# Patient Record
Sex: Male | Born: 1935
Health system: Southern US, Community
[De-identification: ages and names within clinical notes are randomized; demographics above are authoritative.]

## PROBLEM LIST (undated history)

## (undated) DIAGNOSIS — D472 Monoclonal gammopathy: Secondary | ICD-10-CM

## (undated) DIAGNOSIS — H3530 Unspecified macular degeneration: Secondary | ICD-10-CM

## (undated) DIAGNOSIS — K921 Melena: Secondary | ICD-10-CM

## (undated) DIAGNOSIS — L719 Rosacea, unspecified: Secondary | ICD-10-CM

## (undated) DIAGNOSIS — E785 Hyperlipidemia, unspecified: Secondary | ICD-10-CM

## (undated) DIAGNOSIS — M1990 Unspecified osteoarthritis, unspecified site: Secondary | ICD-10-CM

## (undated) DIAGNOSIS — K222 Esophageal obstruction: Secondary | ICD-10-CM

## (undated) DIAGNOSIS — Z8371 Family history of colonic polyps: Secondary | ICD-10-CM

## (undated) DIAGNOSIS — Z83719 Family history of colon polyps, unspecified: Secondary | ICD-10-CM

## (undated) DIAGNOSIS — K589 Irritable bowel syndrome without diarrhea: Secondary | ICD-10-CM

## (undated) DIAGNOSIS — K5792 Diverticulitis of intestine, part unspecified, without perforation or abscess without bleeding: Secondary | ICD-10-CM

## (undated) DIAGNOSIS — K922 Gastrointestinal hemorrhage, unspecified: Secondary | ICD-10-CM

## (undated) DIAGNOSIS — E7800 Pure hypercholesterolemia, unspecified: Secondary | ICD-10-CM

## (undated) HISTORY — DX: Family history of colonic polyps: Z83.71

## (undated) HISTORY — PX: ROTATOR CUFF REPAIR: SHX139

## (undated) HISTORY — DX: Pure hypercholesterolemia, unspecified: E78.00

## (undated) HISTORY — DX: Gastrointestinal hemorrhage, unspecified: K92.2

## (undated) HISTORY — DX: Hyperlipidemia, unspecified: E78.5

## (undated) HISTORY — DX: Monoclonal gammopathy: D47.2

## (undated) HISTORY — PX: INGUINAL HERNIA REPAIR: SHX194

## (undated) HISTORY — DX: Rosacea, unspecified: L71.9

## (undated) HISTORY — DX: Family history of colon polyps, unspecified: Z83.719

## (undated) HISTORY — DX: Esophageal obstruction: K22.2

## (undated) HISTORY — DX: Diverticulitis of intestine, part unspecified, without perforation or abscess without bleeding: K57.92

## (undated) HISTORY — DX: Unspecified osteoarthritis, unspecified site: M19.90

## (undated) HISTORY — PX: OTHER SURGICAL HISTORY: SHX169

## (undated) HISTORY — DX: Irritable bowel syndrome, unspecified: K58.9

## (undated) HISTORY — PX: TONSILLECTOMY: SUR1361

## (undated) HISTORY — DX: Melena: K92.1

## (undated) HISTORY — DX: Unspecified macular degeneration: H35.30

---

## 1997-09-02 ENCOUNTER — Ambulatory Visit (HOSPITAL_COMMUNITY): Admission: RE | Admit: 1997-09-02 | Discharge: 1997-09-02 | Payer: Self-pay | Admitting: Orthopedic Surgery

## 1999-11-11 ENCOUNTER — Encounter (INDEPENDENT_AMBULATORY_CARE_PROVIDER_SITE_OTHER): Payer: Self-pay | Admitting: Gastroenterology

## 1999-11-11 ENCOUNTER — Other Ambulatory Visit: Admission: RE | Admit: 1999-11-11 | Discharge: 1999-11-11 | Payer: Self-pay | Admitting: Gastroenterology

## 1999-11-11 ENCOUNTER — Encounter (INDEPENDENT_AMBULATORY_CARE_PROVIDER_SITE_OTHER): Payer: Self-pay | Admitting: Specialist

## 2003-03-24 ENCOUNTER — Encounter (INDEPENDENT_AMBULATORY_CARE_PROVIDER_SITE_OTHER): Payer: Self-pay | Admitting: Gastroenterology

## 2004-01-16 DIAGNOSIS — D472 Monoclonal gammopathy: Secondary | ICD-10-CM

## 2004-01-16 HISTORY — DX: Monoclonal gammopathy: D47.2

## 2004-04-27 ENCOUNTER — Ambulatory Visit: Payer: Self-pay | Admitting: Oncology

## 2004-07-06 ENCOUNTER — Ambulatory Visit: Payer: Self-pay | Admitting: Oncology

## 2004-11-02 ENCOUNTER — Ambulatory Visit: Payer: Self-pay | Admitting: Oncology

## 2005-03-01 ENCOUNTER — Ambulatory Visit: Payer: Self-pay | Admitting: Oncology

## 2005-03-16 ENCOUNTER — Ambulatory Visit (HOSPITAL_COMMUNITY): Admission: RE | Admit: 2005-03-16 | Discharge: 2005-03-16 | Payer: Self-pay | Admitting: Oncology

## 2005-09-26 ENCOUNTER — Ambulatory Visit: Payer: Self-pay | Admitting: Oncology

## 2005-09-28 LAB — CBC WITH DIFFERENTIAL/PLATELET
BASO%: 0.4 % (ref 0.0–2.0)
Eosinophils Absolute: 0.2 10*3/uL (ref 0.0–0.5)
HCT: 49.3 % (ref 38.7–49.9)
LYMPH%: 14.4 % (ref 14.0–48.0)
MONO#: 0.5 10*3/uL (ref 0.1–0.9)
NEUT#: 6 10*3/uL (ref 1.5–6.5)
NEUT%: 75.8 % — ABNORMAL HIGH (ref 40.0–75.0)
Platelets: 276 10*3/uL (ref 145–400)
RBC: 5.24 10*6/uL (ref 4.20–5.71)
WBC: 7.9 10*3/uL (ref 4.0–10.0)
lymph#: 1.1 10*3/uL (ref 0.9–3.3)

## 2005-09-29 LAB — COMPREHENSIVE METABOLIC PANEL
ALT: 27 U/L (ref 0–40)
Albumin: 4.5 g/dL (ref 3.5–5.2)
CO2: 29 mEq/L (ref 19–32)
Calcium: 9.9 mg/dL (ref 8.4–10.5)
Chloride: 101 mEq/L (ref 96–112)
Glucose, Bld: 140 mg/dL — ABNORMAL HIGH (ref 70–99)
Sodium: 140 mEq/L (ref 135–145)
Total Bilirubin: 0.8 mg/dL (ref 0.3–1.2)
Total Protein: 6.8 g/dL (ref 6.0–8.3)

## 2005-09-29 LAB — LACTATE DEHYDROGENASE: LDH: 173 U/L (ref 94–250)

## 2005-09-29 LAB — IGG, IGA, IGM
IgA: 150 mg/dL (ref 68–378)
IgG (Immunoglobin G), Serum: 854 mg/dL (ref 694–1618)

## 2005-10-04 ENCOUNTER — Ambulatory Visit (HOSPITAL_COMMUNITY): Admission: RE | Admit: 2005-10-04 | Discharge: 2005-10-04 | Payer: Self-pay | Admitting: Oncology

## 2005-11-23 ENCOUNTER — Ambulatory Visit: Payer: Self-pay | Admitting: Family Medicine

## 2005-11-24 ENCOUNTER — Encounter: Admission: RE | Admit: 2005-11-24 | Discharge: 2005-11-24 | Payer: Self-pay | Admitting: Family Medicine

## 2005-11-29 ENCOUNTER — Ambulatory Visit: Payer: Self-pay | Admitting: Family Medicine

## 2005-12-27 ENCOUNTER — Ambulatory Visit: Payer: Self-pay | Admitting: Family Medicine

## 2006-01-10 ENCOUNTER — Ambulatory Visit: Payer: Self-pay | Admitting: Family Medicine

## 2006-02-08 ENCOUNTER — Ambulatory Visit: Payer: Self-pay | Admitting: Family Medicine

## 2006-03-27 ENCOUNTER — Ambulatory Visit: Payer: Self-pay | Admitting: Oncology

## 2006-03-29 LAB — CBC WITH DIFFERENTIAL/PLATELET
BASO%: 0.3 % (ref 0.0–2.0)
EOS%: 2.6 % (ref 0.0–7.0)
LYMPH%: 12.6 % — ABNORMAL LOW (ref 14.0–48.0)
MCH: 32.6 pg (ref 28.0–33.4)
MCHC: 34.9 g/dL (ref 32.0–35.9)
MCV: 93.4 fL (ref 81.6–98.0)
MONO%: 6.8 % (ref 0.0–13.0)
NEUT%: 77.7 % — ABNORMAL HIGH (ref 40.0–75.0)
Platelets: 272 10*3/uL (ref 145–400)
RBC: 4.97 10*6/uL (ref 4.20–5.71)
WBC: 7.9 10*3/uL (ref 4.0–10.0)

## 2006-03-29 LAB — COMPREHENSIVE METABOLIC PANEL
ALT: 24 U/L (ref 0–53)
Alkaline Phosphatase: 62 U/L (ref 39–117)
Creatinine, Ser: 0.94 mg/dL (ref 0.40–1.50)
Sodium: 140 mEq/L (ref 135–145)
Total Bilirubin: 0.8 mg/dL (ref 0.3–1.2)
Total Protein: 7 g/dL (ref 6.0–8.3)

## 2006-03-29 LAB — IGG, IGA, IGM: IgM, Serum: 14 mg/dL — ABNORMAL LOW (ref 60–263)

## 2006-09-25 ENCOUNTER — Ambulatory Visit: Payer: Self-pay | Admitting: Oncology

## 2006-09-27 LAB — CBC WITH DIFFERENTIAL/PLATELET
BASO%: 0.4 % (ref 0.0–2.0)
EOS%: 1.3 % (ref 0.0–7.0)
Eosinophils Absolute: 0.1 10*3/uL (ref 0.0–0.5)
LYMPH%: 11.4 % — ABNORMAL LOW (ref 14.0–48.0)
MCHC: 35.8 g/dL (ref 32.0–35.9)
MCV: 92.6 fL (ref 81.6–98.0)
MONO%: 3.5 % (ref 0.0–13.0)
NEUT#: 7.5 10*3/uL — ABNORMAL HIGH (ref 1.5–6.5)
RBC: 4.82 10*6/uL (ref 4.20–5.71)
RDW: 12.9 % (ref 11.2–14.6)
WBC: 9 10*3/uL (ref 4.0–10.0)

## 2006-09-27 LAB — COMPREHENSIVE METABOLIC PANEL
ALT: 27 U/L (ref 0–53)
AST: 21 U/L (ref 0–37)
Albumin: 4.3 g/dL (ref 3.5–5.2)
Alkaline Phosphatase: 60 U/L (ref 39–117)
Glucose, Bld: 152 mg/dL — ABNORMAL HIGH (ref 70–99)
Potassium: 4.5 mEq/L (ref 3.5–5.3)
Sodium: 143 mEq/L (ref 135–145)
Total Bilirubin: 0.6 mg/dL (ref 0.3–1.2)
Total Protein: 6.6 g/dL (ref 6.0–8.3)

## 2006-09-27 LAB — IGG, IGA, IGM: IgG (Immunoglobin G), Serum: 800 mg/dL (ref 694–1618)

## 2006-11-01 ENCOUNTER — Ambulatory Visit (HOSPITAL_COMMUNITY): Admission: RE | Admit: 2006-11-01 | Discharge: 2006-11-01 | Payer: Self-pay | Admitting: Oncology

## 2006-11-22 ENCOUNTER — Ambulatory Visit: Payer: Self-pay | Admitting: Family Medicine

## 2006-11-28 ENCOUNTER — Encounter: Payer: Self-pay | Admitting: Family Medicine

## 2006-11-30 DIAGNOSIS — Z8719 Personal history of other diseases of the digestive system: Secondary | ICD-10-CM | POA: Insufficient documentation

## 2006-11-30 DIAGNOSIS — Z860100 Personal history of colon polyps, unspecified: Secondary | ICD-10-CM | POA: Insufficient documentation

## 2006-11-30 DIAGNOSIS — R32 Unspecified urinary incontinence: Secondary | ICD-10-CM

## 2006-12-03 LAB — CONVERTED CEMR LAB
Cholesterol: 151 mg/dL (ref 0–200)
HDL: 36.6 mg/dL — ABNORMAL LOW (ref >39.0)
LDL Cholesterol: 95 mg/dL (ref 0–99)
Total CHOL/HDL Ratio: 4.1
Triglycerides: 95 mg/dL (ref 0–149)
VLDL: 19 mg/dL (ref 0–40)

## 2007-01-15 ENCOUNTER — Telehealth (INDEPENDENT_AMBULATORY_CARE_PROVIDER_SITE_OTHER): Payer: Self-pay | Admitting: *Deleted

## 2007-01-17 ENCOUNTER — Encounter: Payer: Self-pay | Admitting: Family Medicine

## 2007-02-01 ENCOUNTER — Encounter: Payer: Self-pay | Admitting: Family Medicine

## 2007-03-07 ENCOUNTER — Ambulatory Visit: Payer: Self-pay | Admitting: Family Medicine

## 2007-03-07 DIAGNOSIS — M129 Arthropathy, unspecified: Secondary | ICD-10-CM | POA: Insufficient documentation

## 2007-03-07 DIAGNOSIS — M810 Age-related osteoporosis without current pathological fracture: Secondary | ICD-10-CM | POA: Insufficient documentation

## 2007-03-26 ENCOUNTER — Ambulatory Visit: Payer: Self-pay | Admitting: Oncology

## 2007-03-28 LAB — CBC WITH DIFFERENTIAL/PLATELET
BASO%: 0.2 % (ref 0.0–2.0)
EOS%: 1.6 % (ref 0.0–7.0)
HCT: 46.3 % (ref 38.7–49.9)
LYMPH%: 11.6 % — ABNORMAL LOW (ref 14.0–48.0)
MCH: 32.6 pg (ref 28.0–33.4)
MCHC: 35.1 g/dL (ref 32.0–35.9)
MCV: 92.7 fL (ref 81.6–98.0)
MONO%: 5 % (ref 0.0–13.0)
NEUT%: 81.6 % — ABNORMAL HIGH (ref 40.0–75.0)
Platelets: 265 10*3/uL (ref 145–400)
RBC: 5 10*6/uL (ref 4.20–5.71)
WBC: 9.8 10*3/uL (ref 4.0–10.0)

## 2007-03-28 LAB — COMPREHENSIVE METABOLIC PANEL
ALT: 24 U/L (ref 0–53)
AST: 21 U/L (ref 0–37)
Alkaline Phosphatase: 65 U/L (ref 39–117)
CO2: 27 mEq/L (ref 19–32)
Creatinine, Ser: 1.08 mg/dL (ref 0.40–1.50)
Sodium: 142 mEq/L (ref 135–145)
Total Bilirubin: 0.7 mg/dL (ref 0.3–1.2)
Total Protein: 7 g/dL (ref 6.0–8.3)

## 2007-03-28 LAB — IGG, IGA, IGM: IgM, Serum: 14 mg/dL — ABNORMAL LOW (ref 60–263)

## 2007-03-28 LAB — LACTATE DEHYDROGENASE: LDH: 155 U/L (ref 94–250)

## 2007-04-18 DIAGNOSIS — L719 Rosacea, unspecified: Secondary | ICD-10-CM

## 2007-04-18 HISTORY — DX: Rosacea, unspecified: L71.9

## 2007-09-24 ENCOUNTER — Ambulatory Visit: Payer: Self-pay | Admitting: Oncology

## 2007-09-26 LAB — CBC WITH DIFFERENTIAL/PLATELET
Basophils Absolute: 0.1 10*3/uL (ref 0.0–0.1)
EOS%: 2.7 % (ref 0.0–7.0)
Eosinophils Absolute: 0.2 10*3/uL (ref 0.0–0.5)
HCT: 43.8 % (ref 38.7–49.9)
HGB: 15.7 g/dL (ref 13.0–17.1)
MCH: 32.3 pg (ref 28.0–33.4)
MCV: 90 fL (ref 81.6–98.0)
MONO%: 10 % (ref 0.0–13.0)
NEUT#: 5.3 10*3/uL (ref 1.5–6.5)
NEUT%: 69.7 % (ref 40.0–75.0)
RDW: 12.1 % (ref 11.2–14.6)
lymph#: 1.3 10*3/uL (ref 0.9–3.3)

## 2007-09-27 LAB — COMPREHENSIVE METABOLIC PANEL
AST: 20 U/L (ref 0–37)
Albumin: 4.3 g/dL (ref 3.5–5.2)
BUN: 19 mg/dL (ref 6–23)
Calcium: 9.3 mg/dL (ref 8.4–10.5)
Chloride: 106 mEq/L (ref 96–112)
Creatinine, Ser: 0.98 mg/dL (ref 0.40–1.50)
Glucose, Bld: 126 mg/dL — ABNORMAL HIGH (ref 70–99)
Potassium: 4.1 mEq/L (ref 3.5–5.3)

## 2007-09-27 LAB — IGG, IGA, IGM
IgA: 151 mg/dL (ref 68–378)
IgG (Immunoglobin G), Serum: 923 mg/dL (ref 694–1618)
IgM, Serum: 12 mg/dL — ABNORMAL LOW (ref 60–263)

## 2008-03-25 ENCOUNTER — Ambulatory Visit: Payer: Self-pay | Admitting: Oncology

## 2008-03-27 LAB — COMPREHENSIVE METABOLIC PANEL
Alkaline Phosphatase: 64 U/L (ref 39–117)
BUN: 16 mg/dL (ref 6–23)
CO2: 27 mEq/L (ref 19–32)
Creatinine, Ser: 0.84 mg/dL (ref 0.40–1.50)
Glucose, Bld: 86 mg/dL (ref 70–99)
Total Bilirubin: 0.8 mg/dL (ref 0.3–1.2)

## 2008-03-27 LAB — CBC WITH DIFFERENTIAL/PLATELET
Eosinophils Absolute: 0.2 10*3/uL (ref 0.0–0.5)
HCT: 49.1 % (ref 38.7–49.9)
LYMPH%: 14.3 % (ref 14.0–48.0)
MCV: 94.5 fL (ref 81.6–98.0)
MONO#: 0.6 10*3/uL (ref 0.1–0.9)
MONO%: 8.9 % (ref 0.0–13.0)
NEUT#: 4.7 10*3/uL (ref 1.5–6.5)
NEUT%: 73.6 % (ref 40.0–75.0)
Platelets: 243 10*3/uL (ref 145–400)
RBC: 5.19 10*6/uL (ref 4.20–5.71)
WBC: 6.4 10*3/uL (ref 4.0–10.0)

## 2008-03-27 LAB — IGG, IGA, IGM: IgA: 168 mg/dL (ref 68–378)

## 2008-03-27 LAB — LACTATE DEHYDROGENASE: LDH: 162 U/L (ref 94–250)

## 2008-09-21 ENCOUNTER — Ambulatory Visit: Payer: Self-pay | Admitting: Oncology

## 2008-09-23 LAB — LACTATE DEHYDROGENASE: LDH: 174 U/L (ref 94–250)

## 2008-09-23 LAB — CBC WITH DIFFERENTIAL/PLATELET
BASO%: 0.6 % (ref 0.0–2.0)
Basophils Absolute: 0.1 10*3/uL (ref 0.0–0.1)
EOS%: 2.2 % (ref 0.0–7.0)
Eosinophils Absolute: 0.2 10*3/uL (ref 0.0–0.5)
HCT: 42.7 % (ref 38.4–49.9)
HGB: 15.4 g/dL (ref 13.0–17.1)
LYMPH%: 14.5 % (ref 14.0–49.0)
MCH: 32.1 pg (ref 27.2–33.4)
MCHC: 36.1 g/dL — ABNORMAL HIGH (ref 32.0–36.0)
MCV: 89 fL (ref 79.3–98.0)
MONO#: 0.6 10*3/uL (ref 0.1–0.9)
MONO%: 7.2 % (ref 0.0–14.0)
NEUT#: 6.2 10*3/uL (ref 1.5–6.5)
NEUT%: 75.5 % — ABNORMAL HIGH (ref 39.0–75.0)
Platelets: 228 10*3/uL (ref 140–400)
RBC: 4.8 10*6/uL (ref 4.20–5.82)
RDW: 13.2 % (ref 11.0–14.6)
WBC: 8.3 10*3/uL (ref 4.0–10.3)
lymph#: 1.2 10*3/uL (ref 0.9–3.3)

## 2008-09-23 LAB — COMPREHENSIVE METABOLIC PANEL
ALT: 18 U/L (ref 0–53)
Albumin: 4.3 g/dL (ref 3.5–5.2)
CO2: 26 mEq/L (ref 19–32)
Glucose, Bld: 99 mg/dL (ref 70–99)
Potassium: 4.4 mEq/L (ref 3.5–5.3)
Sodium: 141 mEq/L (ref 135–145)
Total Bilirubin: 0.7 mg/dL (ref 0.3–1.2)
Total Protein: 7 g/dL (ref 6.0–8.3)

## 2008-09-23 LAB — IGG, IGA, IGM: IgG (Immunoglobin G), Serum: 857 mg/dL (ref 694–1618)

## 2008-10-27 ENCOUNTER — Ambulatory Visit: Payer: Self-pay | Admitting: Family Medicine

## 2008-10-27 DIAGNOSIS — E039 Hypothyroidism, unspecified: Secondary | ICD-10-CM | POA: Insufficient documentation

## 2008-10-27 DIAGNOSIS — Z87898 Personal history of other specified conditions: Secondary | ICD-10-CM | POA: Insufficient documentation

## 2008-10-27 DIAGNOSIS — M353 Polymyalgia rheumatica: Secondary | ICD-10-CM | POA: Insufficient documentation

## 2008-10-27 DIAGNOSIS — T50995A Adverse effect of other drugs, medicaments and biological substances, initial encounter: Secondary | ICD-10-CM | POA: Insufficient documentation

## 2008-10-27 DIAGNOSIS — E785 Hyperlipidemia, unspecified: Secondary | ICD-10-CM

## 2008-10-27 DIAGNOSIS — D649 Anemia, unspecified: Secondary | ICD-10-CM | POA: Insufficient documentation

## 2008-10-27 LAB — HM COLONOSCOPY

## 2008-11-11 ENCOUNTER — Ambulatory Visit: Payer: Self-pay | Admitting: Family Medicine

## 2008-11-11 LAB — CONVERTED CEMR LAB
OCCULT 1: NEGATIVE
OCCULT 2: NEGATIVE
OCCULT 3: NEGATIVE

## 2008-12-23 ENCOUNTER — Ambulatory Visit: Payer: Self-pay | Admitting: Internal Medicine

## 2008-12-23 DIAGNOSIS — R1319 Other dysphagia: Secondary | ICD-10-CM

## 2008-12-23 DIAGNOSIS — R198 Other specified symptoms and signs involving the digestive system and abdomen: Secondary | ICD-10-CM

## 2009-01-12 ENCOUNTER — Encounter: Payer: Self-pay | Admitting: Internal Medicine

## 2009-01-12 ENCOUNTER — Ambulatory Visit: Payer: Self-pay | Admitting: Internal Medicine

## 2009-01-18 ENCOUNTER — Encounter: Payer: Self-pay | Admitting: Internal Medicine

## 2009-02-24 ENCOUNTER — Telehealth: Payer: Self-pay | Admitting: Family Medicine

## 2009-03-09 ENCOUNTER — Telehealth: Payer: Self-pay | Admitting: Family Medicine

## 2009-03-16 ENCOUNTER — Ambulatory Visit: Payer: Self-pay | Admitting: Oncology

## 2009-03-18 LAB — IGG, IGA, IGM: IgG (Immunoglobin G), Serum: 845 mg/dL (ref 694–1618)

## 2009-03-18 LAB — LACTATE DEHYDROGENASE: LDH: 174 U/L (ref 94–250)

## 2009-03-18 LAB — CBC WITH DIFFERENTIAL/PLATELET
Eosinophils Absolute: 0.3 10*3/uL (ref 0.0–0.5)
MONO#: 0.7 10*3/uL (ref 0.1–0.9)
NEUT#: 6.3 10*3/uL (ref 1.5–6.5)
RBC: 4.7 10*6/uL (ref 4.20–5.82)
RDW: 13.5 % (ref 11.0–14.6)
WBC: 8.4 10*3/uL (ref 4.0–10.3)
lymph#: 1.2 10*3/uL (ref 0.9–3.3)

## 2009-03-18 LAB — COMPREHENSIVE METABOLIC PANEL
Albumin: 3.9 g/dL (ref 3.5–5.2)
Alkaline Phosphatase: 67 U/L (ref 39–117)
CO2: 31 mEq/L (ref 19–32)
Chloride: 106 mEq/L (ref 96–112)
Glucose, Bld: 94 mg/dL (ref 70–99)
Potassium: 4.3 mEq/L (ref 3.5–5.3)
Sodium: 142 mEq/L (ref 135–145)
Total Protein: 6.7 g/dL (ref 6.0–8.3)

## 2009-09-15 ENCOUNTER — Ambulatory Visit: Payer: Self-pay | Admitting: Oncology

## 2009-11-04 ENCOUNTER — Ambulatory Visit: Payer: Self-pay | Admitting: Family Medicine

## 2009-11-04 ENCOUNTER — Telehealth: Payer: Self-pay | Admitting: Family Medicine

## 2009-11-04 DIAGNOSIS — K219 Gastro-esophageal reflux disease without esophagitis: Secondary | ICD-10-CM

## 2009-11-04 DIAGNOSIS — J309 Allergic rhinitis, unspecified: Secondary | ICD-10-CM | POA: Insufficient documentation

## 2009-11-04 DIAGNOSIS — E559 Vitamin D deficiency, unspecified: Secondary | ICD-10-CM | POA: Insufficient documentation

## 2009-11-10 ENCOUNTER — Ambulatory Visit: Payer: Self-pay | Admitting: Family Medicine

## 2009-11-10 LAB — CONVERTED CEMR LAB
OCCULT 1: NEGATIVE
OCCULT 2: NEGATIVE
OCCULT 3: NEGATIVE

## 2009-11-15 ENCOUNTER — Encounter: Payer: Self-pay | Admitting: Family Medicine

## 2009-11-24 ENCOUNTER — Encounter: Payer: Self-pay | Admitting: Family Medicine

## 2010-03-15 ENCOUNTER — Ambulatory Visit: Payer: Self-pay | Admitting: Oncology

## 2010-03-17 LAB — CBC WITH DIFFERENTIAL/PLATELET
BASO%: 0.4 % (ref 0.0–2.0)
Basophils Absolute: 0 10*3/uL (ref 0.0–0.1)
EOS%: 0.8 % (ref 0.0–7.0)
Eosinophils Absolute: 0.1 10*3/uL (ref 0.0–0.5)
HCT: 48.2 % (ref 38.4–49.9)
HGB: 16.5 g/dL (ref 13.0–17.1)
LYMPH%: 10.9 % — ABNORMAL LOW (ref 14.0–49.0)
MCH: 32.1 pg (ref 27.2–33.4)
MCHC: 34.2 g/dL (ref 32.0–36.0)
MCV: 93.9 fL (ref 79.3–98.0)
MONO#: 0.5 10*3/uL (ref 0.1–0.9)
MONO%: 5.9 % (ref 0.0–14.0)
NEUT#: 7.4 10*3/uL — ABNORMAL HIGH (ref 1.5–6.5)
NEUT%: 82 % — ABNORMAL HIGH (ref 39.0–75.0)
Platelets: 232 10*3/uL (ref 140–400)
RBC: 5.13 10*6/uL (ref 4.20–5.82)
RDW: 13.4 % (ref 11.0–14.6)
WBC: 9 10*3/uL (ref 4.0–10.3)
lymph#: 1 10*3/uL (ref 0.9–3.3)

## 2010-03-17 LAB — COMPREHENSIVE METABOLIC PANEL
ALT: 28 U/L (ref 0–53)
AST: 25 U/L (ref 0–37)
Albumin: 4.3 g/dL (ref 3.5–5.2)
Alkaline Phosphatase: 81 U/L (ref 39–117)
BUN: 16 mg/dL (ref 6–23)
CO2: 32 mEq/L (ref 19–32)
Calcium: 9.6 mg/dL (ref 8.4–10.5)
Chloride: 102 mEq/L (ref 96–112)
Creatinine, Ser: 1 mg/dL (ref 0.40–1.50)
Glucose, Bld: 135 mg/dL — ABNORMAL HIGH (ref 70–99)
Potassium: 4.4 mEq/L (ref 3.5–5.3)
Sodium: 141 mEq/L (ref 135–145)
Total Bilirubin: 1 mg/dL (ref 0.3–1.2)
Total Protein: 7.3 g/dL (ref 6.0–8.3)

## 2010-03-17 LAB — LACTATE DEHYDROGENASE: LDH: 155 U/L (ref 94–250)

## 2010-05-15 LAB — CONVERTED CEMR LAB
ALT: 19 units/L (ref 0–53)
ALT: 36 units/L (ref 0–53)
AST: 20 units/L (ref 0–37)
AST: 30 units/L (ref 0–37)
Albumin: 4.2 g/dL (ref 3.5–5.2)
Albumin: 4.4 g/dL (ref 3.5–5.2)
Alkaline Phosphatase: 62 units/L (ref 39–117)
Alkaline Phosphatase: 97 units/L (ref 39–117)
BUN: 17 mg/dL (ref 6–23)
BUN: 18 mg/dL (ref 6–23)
Basophils Absolute: 0 10*3/uL (ref 0.0–0.1)
Basophils Absolute: 0.1 10*3/uL (ref 0.0–0.1)
Basophils Relative: 0.2 % (ref 0.0–3.0)
Basophils Relative: 0.7 % (ref 0.0–3.0)
Bilirubin Urine: NEGATIVE
Bilirubin Urine: NEGATIVE
Bilirubin, Direct: 0.1 mg/dL (ref 0.0–0.3)
Bilirubin, Direct: 0.2 mg/dL (ref 0.0–0.3)
Blood in Urine, dipstick: NEGATIVE
Blood in Urine, dipstick: NEGATIVE
CO2: 30 meq/L (ref 19–32)
CO2: 31 meq/L (ref 19–32)
Calcium: 9.6 mg/dL (ref 8.4–10.5)
Calcium: 9.7 mg/dL (ref 8.4–10.5)
Chloride: 104 meq/L (ref 96–112)
Chloride: 107 meq/L (ref 96–112)
Cholesterol: 280 mg/dL — ABNORMAL HIGH (ref 0–200)
Cholesterol: 284 mg/dL — ABNORMAL HIGH (ref 0–200)
Creatinine, Ser: 0.9 mg/dL (ref 0.4–1.5)
Creatinine, Ser: 1 mg/dL (ref 0.4–1.5)
Direct LDL: 203.1 mg/dL
Direct LDL: 216.7 mg/dL
Eosinophils Absolute: 0.2 10*3/uL (ref 0.0–0.7)
Eosinophils Absolute: 0.2 10*3/uL (ref 0.0–0.7)
Eosinophils Relative: 1.7 % (ref 0.0–5.0)
Eosinophils Relative: 2.2 % (ref 0.0–5.0)
GFR calc non Af Amer: 75.97 mL/min (ref >60)
GFR calc non Af Amer: 89 mL/min (ref >60)
Glucose, Bld: 104 mg/dL — ABNORMAL HIGH (ref 70–99)
Glucose, Bld: 94 mg/dL (ref 70–99)
Glucose, Urine, Semiquant: NEGATIVE
Glucose, Urine, Semiquant: NEGATIVE
HCT: 46.3 % (ref 39.0–52.0)
HCT: 46.9 % (ref 39.0–52.0)
HDL: 35.8 mg/dL — ABNORMAL LOW (ref >39.00)
HDL: 36 mg/dL — ABNORMAL LOW (ref >39.00)
Hemoglobin: 16.5 g/dL (ref 13.0–17.0)
Hemoglobin: 16.7 g/dL (ref 13.0–17.0)
Ketones, urine, test strip: NEGATIVE
Ketones, urine, test strip: NEGATIVE
Lymphocytes Relative: 10.8 % — ABNORMAL LOW (ref 12.0–46.0)
Lymphocytes Relative: 20.8 % (ref 12.0–46.0)
Lymphs Abs: 0.9 10*3/uL (ref 0.7–4.0)
Lymphs Abs: 1.5 10*3/uL (ref 0.7–4.0)
MCHC: 35.3 g/dL (ref 30.0–36.0)
MCHC: 36 g/dL (ref 30.0–36.0)
MCV: 91.8 fL (ref 78.0–100.0)
MCV: 94.9 fL (ref 78.0–100.0)
Monocytes Absolute: 0.6 10*3/uL (ref 0.1–1.0)
Monocytes Absolute: 0.7 10*3/uL (ref 0.1–1.0)
Monocytes Relative: 6.9 % (ref 3.0–12.0)
Monocytes Relative: 8.9 % (ref 3.0–12.0)
Neutro Abs: 5 10*3/uL (ref 1.4–7.7)
Neutro Abs: 7 10*3/uL (ref 1.4–7.7)
Neutrophils Relative %: 67.9 % (ref 43.0–77.0)
Neutrophils Relative %: 79.9 % — ABNORMAL HIGH (ref 43.0–77.0)
Nitrite: NEGATIVE
Nitrite: NEGATIVE
PSA: 5.2 ng/mL — ABNORMAL HIGH (ref 0.10–4.00)
PSA: 6.92 ng/mL — ABNORMAL HIGH (ref 0.10–4.00)
Platelets: 241 10*3/uL (ref 150.0–400.0)
Platelets: 274 10*3/uL (ref 150.0–400.0)
Potassium: 5 meq/L (ref 3.5–5.1)
Potassium: 5.2 meq/L — ABNORMAL HIGH (ref 3.5–5.1)
RBC: 4.94 M/uL (ref 4.22–5.81)
RBC: 5.04 M/uL (ref 4.22–5.81)
RDW: 12.5 % (ref 11.5–14.6)
RDW: 13.4 % (ref 11.5–14.6)
Sodium: 142 meq/L (ref 135–145)
Sodium: 146 meq/L — ABNORMAL HIGH (ref 135–145)
Specific Gravity, Urine: 1.02
Specific Gravity, Urine: 1.025
TSH: 1.16 microintl units/mL (ref 0.35–5.50)
TSH: 1.39 microintl units/mL (ref 0.35–5.50)
Total Bilirubin: 1 mg/dL (ref 0.3–1.2)
Total Bilirubin: 1.3 mg/dL — ABNORMAL HIGH (ref 0.3–1.2)
Total CHOL/HDL Ratio: 8
Total CHOL/HDL Ratio: 8
Total Protein: 7.3 g/dL (ref 6.0–8.3)
Total Protein: 7.3 g/dL (ref 6.0–8.3)
Triglycerides: 180 mg/dL — ABNORMAL HIGH (ref 0.0–149.0)
Triglycerides: 244 mg/dL — ABNORMAL HIGH (ref 0.0–149.0)
Urobilinogen, UA: 0.2
Urobilinogen, UA: 0.2
VLDL: 36 mg/dL (ref 0.0–40.0)
VLDL: 48.8 mg/dL — ABNORMAL HIGH (ref 0.0–40.0)
Vit D, 25-Hydroxy: 20 ng/mL — ABNORMAL LOW (ref 30–89)
Vit D, 25-Hydroxy: 24 ng/mL — ABNORMAL LOW (ref 30–89)
WBC Urine, dipstick: NEGATIVE
WBC Urine, dipstick: NEGATIVE
WBC: 7.4 10*3/uL (ref 4.5–10.5)
WBC: 8.7 10*3/uL (ref 4.5–10.5)
pH: 7
pH: 7

## 2010-05-19 NOTE — Assessment & Plan Note (Signed)
Summary: EMP/PT FASTING/CJR   Vital Signs:  Patient profile:   77 year old male Height:      67 inches Weight:      169 pounds BMI:     26.56 O2 Sat:      94 % Temp:     98 degrees F Pulse rate:   60 / minute Pulse rhythm:   regular BP sitting:   134 / 78  (left arm)  Vitals Entered By: Pura Spice, RN (November 04, 2009 8:31 AM)  Contraindications/Deferment of Procedures/Staging:    Test/Procedure: Pneumovax vaccine    Reason for deferment: patient declined     Test/Procedure: TD vaccine    Reason for deferment: declined  CC: go over problems refill meds pt stated stopped all meds about 8 months ago.  Is Patient Diabetic? 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 Colletta Maryland for his arthritis and osteoporosis He relates he's not been taking any medications for the past 8 months Has no urinary symptoms no nocturia no urgency no increased frequencybut does have some urinary incontinence at times Continues to have GERD with some dysphagia which is controlled with Nexium and to restart Nexium 40 mg q. day  Allergies (verified): No Known Drug Allergies  Past History:  Past Medical History: Last updated: 12/23/2008 Arthritis Blood in Stool High Cholesterol Colonic polyps, hx of (hyperplastic) Diverticulitis, hx of Urinary incontinence Esophageal Stricture GI Bleed Irritable Bowel Syndrome  Past Surgical History: Last updated: 11/30/2006 Inguinal herniorrhaphy Rotator cuff repair Diviated Septum Tonsillectomy  Social History: Last updated: 12/23/2008 Retired Widowed Former  Smoker Alcohol use-yes: one per month  Drug use-no Regular exercise-no Daily Caffeine Use: one daily   Risk Factors: Smoking Status: quit (11/30/2006)  Review of Systems      See HPI  The patient denies anorexia, fever, weight loss, weight gain, vision loss, decreased hearing, hoarseness, chest pain, syncope, dyspnea on exertion, peripheral edema, prolonged cough, headaches, hemoptysis, abdominal pain, melena, hematochezia, severe indigestion/heartburn, hematuria, incontinence, genital sores, muscle weakness, suspicious skin lesions, transient blindness, difficulty walking, depression, unusual weight change, abnormal bleeding, enlarged lymph nodes, angioedema, breast masses, and testicular masses.    Physical Exam  General:  Well-developed,well-nourished,in no acute distress; alert,appropriate and cooperative throughout examination Head:  Normocephalic and atraumatic without obvious abnormalities. No apparent alopecia or balding. Eyes:  No corneal or conjunctival inflammation noted. EOMI. Perrla. Funduscopic exam benign, without hemorrhages, exudates or papilledema. Vision grossly normal. Ears:  External ear exam shows no significant lesions or deformities.  Otoscopic examination reveals clear canals, tympanic membranes are intact bilaterally without bulging, retraction, inflammation or discharge. Hearing is grossly normal bilaterally. Nose:  pale boggy nasal mucosa with postnasal drainage clear drainage Mouth:  Oral mucosa and oropharynx without lesions or exudates.  Teeth in good repair. Neck:  No deformities, masses, or tenderness noted. Chest Clevinger:  No deformities, masses, tenderness or gynecomastia noted. Breasts:  No masses or gynecomastia noted Lungs:  Normal respiratory effort, chest expands symmetrically. Lungs are clear to auscultation, no crackles or wheezes. Heart:  Normal rate and regular rhythm. S1 and S2 normal without  gallop, murmur, click, rub or other extra  sounds. Abdomen:  Bowel sounds positive,abdomen soft and non-tender without masses, organomegaly or hernias noted. Rectal:  No external abnormalities noted. Normal sphincter tone. No rectal masses or tenderness. Genitalia:  Testes bilaterally descended without nodularity, tenderness or masses. No scrotal masses or lesions. No penis lesions or urethral discharge. Prostate:  1+ enlarged.   Msk:  arthritic changes of stools we'll hand and also some tenderness both in the medially and laterally Pulses:  R and L carotid,radial,femoral,dorsalis pedis and posterior tibial pulses are full and equal bilaterally Extremities:  No clubbing, cyanosis, edema, or deformity noted with normal full range of motion of all joints.   Neurologic:  No cranial nerve deficits noted. Station and gait are normal. Plantar reflexes are down-going bilaterally. DTRs are symmetrical throughout. Sensory, motor and coordinative functions appear intact. Skin:  Intact without suspicious lesions or rashes Cervical Nodes:  No lymphadenopathy noted Axillary Nodes:  No palpable lymphadenopathy Inguinal Nodes:  No significant adenopathy Psych:  Cognition and judgment appear intact. Alert and cooperative with normal attention span and concentration. No apparent delusions, illusions, hallucinations   Impression & Recommendations:  Problem # 1:  ALLERGIC RHINITIS (ICD-477.9) Assessment New past frontal nasal spray  Problem # 2:  GERD (ICD-530.81) Assessment: Improved Nexium 40 mg q.d.  Problem # 3:  POLYMYALGIA RHEUMATICA (ICD-725) Assessment: Unchanged  His updated medication list for this problem includes:    Aspirin 81 Mg Tabs (Aspirin) ..... One tablet by mouth daily  Problem # 4:  HYPERLIPIDEMIA (ICD-272.4) Assessment: Unchanged  The following medications were removed from the medication list:    Simvastatin 80 Mg Tabs (Simvastatin) .Marland Kitchen... 1 by mouth at bedtime His updated medication list for this problem includes:     Crestor 10 Mg Tabs (Rosuvastatin calcium) ..... One q.d.  Orders: Specimen Handling (56433) Venipuncture (29518) TLB-Lipid Panel (80061-LIPID) TLB-Hepatic/Liver Function Pnl (80076-HEPATIC)  Problem # 5:  OSTEOPOROSIS (ICD-733.00) Assessment: Improved  His updated medication list for this problem includes:    Actonel 150 Mg Tabs (Risedronate sodium) .Marland Kitchen... 1 per month as directedx    Vitamin D (ergocalciferol) 50000 Unit Caps (Ergocalciferol) .Marland Kitchen... 1 by mouth every week for 12 weeks  Problem # 6:  URINARY INCONTINENCE (ICD-788.30) Assessment: Improved  Problem # 9:  VITAMIN D DEFICIENCY (ICD-268.9) Assessment: New  Orders: T-Vitamin D (25-Hydroxy) (84166-06301) Specimen Handling (60109) Venipuncture (32355)  Problem # 10:  BENIGN PROSTATIC HYPERTROPHY, HX OF (ICD-V13.8) Assessment: Unchanged Enablex 7.5 mg qd  Complete Medication List: 1)  Aspirin 81 Mg Tabs (Aspirin) .... One tablet by mouth daily 2)  Actonel 150 Mg Tabs (Risedronate sodium) .Marland Kitchen.. 1 per month as directedx 3)  Vitamin D (ergocalciferol) 50000 Unit Caps (Ergocalciferol) .Marland Kitchen.. 1 by mouth every week for 12 weeks 4)  Enablex 7.5 Mg Xr24h-tab (Darifenacin hydrobromide) .... As needed 5)  Hydrocodone-homatropine 5-1.5 Mg/17ml Syrp (Hydrocodone-homatropine) .Marland Kitchen.. 1-2 tsp every 4-6 hrs as needed cough 6)  Crestor 10 Mg Tabs (Rosuvastatin calcium) .... One q.d.  Other Orders: EKG w/ Interpretation (93000) UA Dipstick w/o Micro (automated)  (81003) TLB-BMP (Basic Metabolic Panel-BMET) (80048-METABOL) TLB-CBC Platelet - w/Differential (85025-CBCD) TLB-TSH (Thyroid Stimulating Hormone) (84443-TSH) TLB-PSA (Prostate Specific Antigen) (84153-PSA)  Patient Instructions: 1)  To get Bone density stidy after geting site of the last 2)  Astepro 2 sprays each nostril each day 3)  Take Nexium 1 each day 4)  Try Enablex or vesicare for urina urgency 5)  cont ASA 81 mg qd Prescriptions: CRESTOR 10 MG  TABS (ROSUVASTATIN  CALCIUM) one q.d.  #30 x 11   Entered and Authorized by:   Judithann Sheen MD   Signed by:   Judithann Sheen MD on 11/15/2009   Method used:   Print then Give to Patient   RxID:   [id number] VITAMIN D (ERGOCALCIFEROL) 50000 UNIT CAPS (ERGOCALCIFEROL) 1 by mouth every week for 12 weeks  #12 x 2   Entered and Authorized by:   Judithann Sheen MD   Signed by:   Judithann Sheen MD on 11/07/2009   Method used:   Electronically to        [facility]* (retail)       [address] [place], [place]  [address]       Ph: [phone]       Fax: [phone]   RxID:   [id number]   Laboratory Results   Urine Tests  Date/Time Recieved: November 04, 2009 11:04 AM  Date/Time Reported: November 04, 2009 11:04 AM   Routine Urinalysis   Color: yellow Appearance: Clear Glucose: negative   (Normal Range: Negative) Bilirubin: negative   (Normal Range: Negative) Ketone: negative   (Normal Range: Negative) Spec. Gravity: 1.025   (Normal Range: 1.003-1.035) Blood: negative   (Normal Range: Negative) pH: 7.0   (Normal Range: 5.0-8.0) Protein: trace   (Normal Range: Negative) Urobilinogen: 0.2   (Normal Range: 0-1) Nitrite: negative   (Normal Range: Negative) Leukocyte Esterace: negative   (Normal Range: Negative)    Comments: Wynona Canes, CMA  November 04, 2009 11:04 AM

## 2010-05-19 NOTE — Letter (Signed)
Summary: Results Follow-up Letter  [place] [facility]  [address] [place], [place] [address]   Phone: [phone]  Fax: [phone]    11/15/2009  [address] [place], [place]  [address]  Dear Mr. FLANERY,   The following are the results of your recent test(s): Hemocult cards were all negative.        Sincerely,     Dr Gwenyth Bender Stafford,MD  [facility]

## 2010-05-19 NOTE — Progress Notes (Signed)
Summary: Pt needs referral to get Bone Density test done  Phone Note Call from Patient Call back at Work Phone [phone]   Caller: Patient Summary of Call: Pt called and said that [facility], told pt that he would need to get referral from Dr. Scotty Court in order to get bone density done. Pls call.  Initial call taken by: Lucy Antigua,  November 04, 2009 11:03 AM  Follow-up for Phone Call        ok per dr Alfonzo Feller and request forwarded to [facility]  Follow-up by: Pura Spice, RN,  November 04, 2009 11:16 AM

## 2010-06-27 ENCOUNTER — Encounter: Payer: Self-pay | Admitting: Family Medicine

## 2010-07-12 ENCOUNTER — Encounter: Payer: Self-pay | Admitting: Family Medicine

## 2010-07-12 ENCOUNTER — Ambulatory Visit (INDEPENDENT_AMBULATORY_CARE_PROVIDER_SITE_OTHER): Payer: Medicare Other | Admitting: Family Medicine

## 2010-07-12 VITALS — BP 122/72 | HR 76 | Temp 98.1°F | Wt 165.0 lb

## 2010-07-12 DIAGNOSIS — N138 Other obstructive and reflux uropathy: Secondary | ICD-10-CM

## 2010-07-12 DIAGNOSIS — N419 Inflammatory disease of prostate, unspecified: Secondary | ICD-10-CM

## 2010-07-12 DIAGNOSIS — R3989 Other symptoms and signs involving the genitourinary system: Secondary | ICD-10-CM

## 2010-07-12 DIAGNOSIS — N139 Obstructive and reflux uropathy, unspecified: Secondary | ICD-10-CM

## 2010-07-12 DIAGNOSIS — M1711 Unilateral primary osteoarthritis, right knee: Secondary | ICD-10-CM

## 2010-07-12 DIAGNOSIS — R05 Cough: Secondary | ICD-10-CM

## 2010-07-12 DIAGNOSIS — R3915 Urgency of urination: Secondary | ICD-10-CM

## 2010-07-12 DIAGNOSIS — N401 Enlarged prostate with lower urinary tract symptoms: Secondary | ICD-10-CM

## 2010-07-12 DIAGNOSIS — L719 Rosacea, unspecified: Secondary | ICD-10-CM

## 2010-07-12 DIAGNOSIS — M810 Age-related osteoporosis without current pathological fracture: Secondary | ICD-10-CM

## 2010-07-12 DIAGNOSIS — M353 Polymyalgia rheumatica: Secondary | ICD-10-CM

## 2010-07-12 DIAGNOSIS — E785 Hyperlipidemia, unspecified: Secondary | ICD-10-CM

## 2010-07-12 DIAGNOSIS — R39198 Other difficulties with micturition: Secondary | ICD-10-CM

## 2010-07-12 MED ORDER — CIPROFLOXACIN HCL 500 MG PO TABS: 500.0000 mg | ORAL_TABLET | Freq: Two times a day (BID) | ORAL | Status: AC

## 2010-07-12 MED ORDER — ERGOCALCIFEROL 1.25 MG (50000 UT) PO CAPS: 50000.0000 [IU] | ORAL_CAPSULE | ORAL | Status: DC

## 2010-07-12 NOTE — Patient Instructions (Addendum)
Tiler, your prostate is larger than the last exam and you also have prostatitis Haynes Bast will help with enlarged prostate  And urgency of urination and nocturia Before Lonna Cobb is out call me either here or on my cell phone [phone] To change to avodart, I want to recheck prostate in 3 months, also I need to treat you wit cipro 500mg  twice daily for 14 days Stop actonel start vit D 50000 units weekly \\Take  celebrex 200 mg after breakfat and supper, for  The knee Start Nexium 400 mg each am to help stop cough, cough sounds like gerd  to get chest xray for follow up lastt xray and also since you are continuing to cough Go see Dr. Suzzanne Cloud Take  1/2 tab crestor each day for hyperlipedemia

## 2010-07-13 ENCOUNTER — Ambulatory Visit (INDEPENDENT_AMBULATORY_CARE_PROVIDER_SITE_OTHER)
Admission: RE | Admit: 2010-07-13 | Discharge: 2010-07-13 | Disposition: A | Discharge status: Home / Self Care | Payer: Medicare Other | Source: Ambulatory Visit | Attending: Family Medicine | Admitting: Family Medicine

## 2010-07-13 DIAGNOSIS — R053 Chronic cough: Secondary | ICD-10-CM

## 2010-07-13 DIAGNOSIS — R05 Cough: Secondary | ICD-10-CM

## 2010-08-08 NOTE — Progress Notes (Signed)
  Subjective:    Patient ID: Bryan Norris, male    DOB: Nov 01, 1924, 77 y.o.   MRN: [id number] This 77 year old white male who over his in with multiple complaints he has been having pain in both knees but especially the right knee which is difficult to be in and he also had injection in the right knee by Dr. Darrol Poke has been under the care of rheumatologist for polymyalgia rheumatica Under care doctor to have seen dermatologist for rosacea  Has had GROUP DENTAL PROBLEMS GOING TO 3 DIFFERENT THAN HIS AND SOME CONCERN OVER THE BONE LOSS IN THE JAW WHICH MAY BE SECONDARY TO ACTONEL SO THIS WAS STOPPED Patient has began having difficult urination as well as increased urinary urgency and frequency One of his main complaints is that it is that he has had a chronic cough over the past several months but no fever and not productive and from the description it sounds like a reflux cough normal however plan to get a chest x-ray   Review of Systemssee history of present illness    Objective:   Physical Examthe patient is a well-developed well-nourished white male who appears depressed but not in distress HEENT negative carotid pulses are good thyroid is nonpalpable multiple bridges fillings an implants Lungs clear to palpation percussion and auscultation no rales or wheezes no dullness to percussion  Heart no evidence of cardiomegaly heart sounds are good without murmurs rectal willAbdomen liver spleen and kidneys are nonpalpable no masses bowel sounds normal Rectal examination reveals prostate to be enlarged x2 Right knee is swollen stiff and painful movement Left knee slightly swollen and tender bilaterally       Assessment & Plan:  BPH with obstruction to treat with Jalyn.  And Vesicare Chronic cough assessed to be secondary to GERD and we'll treat with Hycodan for cough and Nexium to stop the cough Arthritis and polymyalgia rheumatica to treat with Celebrex 200 mg b.i.d. For his multiple dental  problems have referred to Dr. Rosanna Randy Rosacea improved but to continue doxycycline 50 mg b.i.d. Hyperlipidemia to restart Crestor one half tab q.d. Or 5 mg q. Day for cardiovascular function he is to continue aspirin 81 mg q.d. And he is already taking tumeric and herbal extract

## 2010-08-24 ENCOUNTER — Other Ambulatory Visit: Payer: Self-pay | Admitting: Family Medicine

## 2010-09-15 ENCOUNTER — Other Ambulatory Visit (HOSPITAL_COMMUNITY): Payer: Self-pay | Admitting: Oncology

## 2010-09-15 ENCOUNTER — Encounter (HOSPITAL_BASED_OUTPATIENT_CLINIC_OR_DEPARTMENT_OTHER): Payer: Medicare Other | Admitting: Oncology

## 2010-09-15 DIAGNOSIS — D472 Monoclonal gammopathy: Secondary | ICD-10-CM

## 2010-09-15 LAB — COMPREHENSIVE METABOLIC PANEL
ALT: 20 U/L (ref 0–53)
AST: 21 U/L (ref 0–37)
Albumin: 4.2 g/dL (ref 3.5–5.2)
Alkaline Phosphatase: 83 U/L (ref 39–117)
Calcium: 9 mg/dL (ref 8.4–10.5)
Chloride: 104 mEq/L (ref 96–112)
Potassium: 4.1 mEq/L (ref 3.5–5.3)

## 2010-09-15 LAB — CBC WITH DIFFERENTIAL/PLATELET
BASO%: 0.4 % (ref 0.0–2.0)
EOS%: 2.3 % (ref 0.0–7.0)
MCH: 32 pg (ref 27.2–33.4)
MCHC: 34.3 g/dL (ref 32.0–36.0)
MCV: 93.2 fL (ref 79.3–98.0)
MONO%: 8.6 % (ref 0.0–14.0)
RBC: 4.74 10*6/uL (ref 4.20–5.82)
RDW: 13 % (ref 11.0–14.6)
lymph#: 1 10*3/uL (ref 0.9–3.3)

## 2010-09-16 LAB — IGG, IGA, IGM

## 2011-01-04 ENCOUNTER — Other Ambulatory Visit: Payer: Self-pay

## 2011-01-04 MED ORDER — ROSUVASTATIN CALCIUM 10 MG PO TABS: 10.0000 mg | ORAL_TABLET | Freq: Every day | ORAL | Status: DC

## 2011-01-04 NOTE — Telephone Encounter (Signed)
rx sent into pharmacy

## 2011-02-01 ENCOUNTER — Other Ambulatory Visit: Payer: Self-pay | Admitting: Family Medicine

## 2011-02-01 DIAGNOSIS — Z87898 Personal history of other specified conditions: Secondary | ICD-10-CM

## 2011-02-01 DIAGNOSIS — E785 Hyperlipidemia, unspecified: Secondary | ICD-10-CM

## 2011-02-01 DIAGNOSIS — D649 Anemia, unspecified: Secondary | ICD-10-CM

## 2011-02-02 ENCOUNTER — Other Ambulatory Visit (INDEPENDENT_AMBULATORY_CARE_PROVIDER_SITE_OTHER): Payer: Medicare Other

## 2011-02-02 DIAGNOSIS — D649 Anemia, unspecified: Secondary | ICD-10-CM

## 2011-02-02 DIAGNOSIS — Z87898 Personal history of other specified conditions: Secondary | ICD-10-CM

## 2011-02-02 DIAGNOSIS — E785 Hyperlipidemia, unspecified: Secondary | ICD-10-CM

## 2011-02-02 LAB — BASIC METABOLIC PANEL
BUN: 15 mg/dL (ref 6–23)
CO2: 31 mEq/L (ref 19–32)
Calcium: 9.6 mg/dL (ref 8.4–10.5)
GFR: 81.15 mL/min (ref >60.00)
Glucose, Bld: 98 mg/dL (ref 70–99)

## 2011-02-02 LAB — HEPATIC FUNCTION PANEL
ALT: 25 U/L (ref 0–53)
AST: 22 U/L (ref 0–37)
Bilirubin, Direct: 0.1 mg/dL (ref 0.0–0.3)
Total Bilirubin: 1.1 mg/dL (ref 0.3–1.2)

## 2011-02-27 ENCOUNTER — Other Ambulatory Visit: Payer: Self-pay | Admitting: Family Medicine

## 2011-02-27 DIAGNOSIS — E559 Vitamin D deficiency, unspecified: Secondary | ICD-10-CM

## 2011-02-27 DIAGNOSIS — D649 Anemia, unspecified: Secondary | ICD-10-CM

## 2011-02-27 DIAGNOSIS — M810 Age-related osteoporosis without current pathological fracture: Secondary | ICD-10-CM

## 2011-02-28 NOTE — Progress Notes (Signed)
Quick Note:  Pt aware and requested to pick up a copy. ______

## 2011-03-15 ENCOUNTER — Encounter: Payer: Self-pay | Admitting: Medical Oncology

## 2011-03-20 ENCOUNTER — Other Ambulatory Visit (HOSPITAL_BASED_OUTPATIENT_CLINIC_OR_DEPARTMENT_OTHER): Payer: Medicare Other | Admitting: Lab

## 2011-03-20 ENCOUNTER — Other Ambulatory Visit (HOSPITAL_COMMUNITY): Payer: Self-pay | Admitting: Oncology

## 2011-03-20 ENCOUNTER — Ambulatory Visit (HOSPITAL_BASED_OUTPATIENT_CLINIC_OR_DEPARTMENT_OTHER): Payer: Medicare Other | Admitting: Oncology

## 2011-03-20 ENCOUNTER — Telehealth: Payer: Self-pay | Admitting: Oncology

## 2011-03-20 VITALS — BP 168/81 | HR 58 | Temp 98.2°F | Ht 67.0 in | Wt 169.6 lb

## 2011-03-20 DIAGNOSIS — D472 Monoclonal gammopathy: Secondary | ICD-10-CM

## 2011-03-20 DIAGNOSIS — E559 Vitamin D deficiency, unspecified: Secondary | ICD-10-CM

## 2011-03-20 DIAGNOSIS — M810 Age-related osteoporosis without current pathological fracture: Secondary | ICD-10-CM

## 2011-03-20 DIAGNOSIS — M353 Polymyalgia rheumatica: Secondary | ICD-10-CM

## 2011-03-20 LAB — CBC WITH DIFFERENTIAL/PLATELET
Basophils Absolute: 0 10*3/uL (ref 0.0–0.1)
EOS%: 2.2 % (ref 0.0–7.0)
HGB: 17 g/dL (ref 13.0–17.1)
LYMPH%: 12.8 % — ABNORMAL LOW (ref 14.0–49.0)
MCH: 32.7 pg (ref 27.2–33.4)
MCV: 93.6 fL (ref 79.3–98.0)
MONO%: 7.9 % (ref 0.0–14.0)
Platelets: 220 10*3/uL (ref 140–400)
RBC: 5.19 10*6/uL (ref 4.20–5.82)
RDW: 13.2 % (ref 11.0–14.6)

## 2011-03-20 LAB — COMPREHENSIVE METABOLIC PANEL
ALT: 22 U/L (ref 0–53)
Albumin: 4.2 g/dL (ref 3.5–5.2)
Alkaline Phosphatase: 85 U/L (ref 39–117)
CO2: 32 mEq/L (ref 19–32)
Glucose, Bld: 106 mg/dL — ABNORMAL HIGH (ref 70–99)
Potassium: 4.7 mEq/L (ref 3.5–5.3)
Sodium: 138 mEq/L (ref 135–145)
Total Bilirubin: 0.4 mg/dL (ref 0.3–1.2)
Total Protein: 7.4 g/dL (ref 6.0–8.3)

## 2011-03-20 LAB — LACTATE DEHYDROGENASE: LDH: 189 U/L (ref 94–250)

## 2011-03-20 NOTE — Progress Notes (Signed)
CC:   Pollyann Savoy, M.D.  HISTORY:  I saw Bryan Norris today for followup of his IgG kappa monoclonal gammopathy first detected in October 2005.  Bryan Norris was last seen by [place] on 03/17/2010.  There has been little change in his condition.  Bryan Norris is now 76 years old.  He remains active, living alone, very independent. 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  There has really been no change in his condition and no symptoms to suggest multiple myeloma.  He does have some problems controlling his urine.  PROBLEM LIST: 1. Monoclonal gammopathy of uncertain significance detected in October     2005.  Urine immunofixation electrophoresis in March 2006 was     negative.  The patient has not had a bone marrow and his     quantitative immunoglobulins have been stable. 2. Hypothyroidism. 3. Vitamin D deficiency. 4. Dyslipidemia. 5. Allergic rhinitis. 6. GERD. 7. Polymyalgia rheumatica. 8. Osteoporosis. 9. Urinary incontinence. 10.History of colonic polyps. 11.History of diverticulitis. 12.BPH. 13.Rosacea.  MEDICINES:  Aspirin 81 mg daily, Enablex, doxycycline, vitamin D, omega fish oil, Crestor.  PHYSICAL EXAM:  General:  Bryan Norris looks well.  He is well attired. Weight is 169.6 pounds.  Height 5 feet 7 inches.  Body surface area 1.91 sq m.  Vital Signs:  Blood pressure today 168/81 right arm sitting.  The patient was informed about his elevated blood pressure.  Other vital signs are normal.  HEENT:  There is no scleral icterus.  Mouth and pharynx are benign.  No peripheral adenopathy palpable.  Heart/lungs: Normal.  Abdomen:  Benign.  Extremities:  No peripheral edema, clubbing, palmar erythema.  No musculoskeletal tenderness.  LABORATORY DATA:  Today, white count 9.2, ANC 7.1, hemoglobin 17.0, hematocrit 48.6, platelets 220,000.  Chemistries were normal.  Total protein 7.4, albumin 4.2.  Quantitative immunoglobulins are pending. From 09/15/2010,  IgG level was 822 which is normal, IgA 156, and IgM was less than 20.  Chest x-ray, 2 views from 07/13/2010, was within normal limits with no acute cardiopulmonary disease.  IMPRESSION AND PLAN:  Bryan Norris continues to do well now 7 years from the time of detection of his monoclonal gammopathy that is most likely benign.  Bryan Norris would like to come see [place] again in 1 year.  We will be happy to see him at that time.  We will check CBC, chemistries, and quantitative immunoglobulins.    ______________________________ Samul Dada, M.D. DSM/MEDQ  D:  03/20/2011  T:  03/20/2011  Job:  [id number]

## 2011-03-20 NOTE — Progress Notes (Signed)
This office note has been dictated.  #[id number]

## 2011-03-20 NOTE — Telephone Encounter (Signed)
gvr the pt his dec 2013 appt calendar

## 2011-03-21 LAB — IGG, IGA, IGM
IgA: 153 mg/dL (ref 68–379)
IgG (Immunoglobin G), Serum: 975 mg/dL (ref 650–1600)

## 2011-05-11 ENCOUNTER — Other Ambulatory Visit: Payer: Self-pay | Admitting: Dermatology

## 2011-05-11 DIAGNOSIS — L719 Rosacea, unspecified: Secondary | ICD-10-CM | POA: Diagnosis not present

## 2011-05-11 DIAGNOSIS — D485 Neoplasm of uncertain behavior of skin: Secondary | ICD-10-CM | POA: Diagnosis not present

## 2011-05-11 DIAGNOSIS — L821 Other seborrheic keratosis: Secondary | ICD-10-CM | POA: Diagnosis not present

## 2011-06-01 DIAGNOSIS — Z961 Presence of intraocular lens: Secondary | ICD-10-CM | POA: Diagnosis not present

## 2011-06-01 DIAGNOSIS — H43819 Vitreous degeneration, unspecified eye: Secondary | ICD-10-CM | POA: Diagnosis not present

## 2011-06-01 DIAGNOSIS — H353190 Nonexudative age-related macular degeneration, unspecified eye, stage unspecified: Secondary | ICD-10-CM | POA: Diagnosis not present

## 2011-06-08 DIAGNOSIS — L719 Rosacea, unspecified: Secondary | ICD-10-CM | POA: Diagnosis not present

## 2011-06-16 ENCOUNTER — Ambulatory Visit (INDEPENDENT_AMBULATORY_CARE_PROVIDER_SITE_OTHER): Payer: Medicare Other | Admitting: Internal Medicine

## 2011-06-16 ENCOUNTER — Encounter: Payer: Self-pay | Admitting: Internal Medicine

## 2011-06-16 ENCOUNTER — Other Ambulatory Visit (INDEPENDENT_AMBULATORY_CARE_PROVIDER_SITE_OTHER): Payer: Medicare Other

## 2011-06-16 DIAGNOSIS — M810 Age-related osteoporosis without current pathological fracture: Secondary | ICD-10-CM | POA: Diagnosis not present

## 2011-06-16 DIAGNOSIS — E559 Vitamin D deficiency, unspecified: Secondary | ICD-10-CM

## 2011-06-16 DIAGNOSIS — E039 Hypothyroidism, unspecified: Secondary | ICD-10-CM

## 2011-06-16 DIAGNOSIS — M353 Polymyalgia rheumatica: Secondary | ICD-10-CM | POA: Diagnosis not present

## 2011-06-16 DIAGNOSIS — R7309 Other abnormal glucose: Secondary | ICD-10-CM

## 2011-06-16 DIAGNOSIS — E785 Hyperlipidemia, unspecified: Secondary | ICD-10-CM

## 2011-06-16 DIAGNOSIS — N400 Enlarged prostate without lower urinary tract symptoms: Secondary | ICD-10-CM | POA: Insufficient documentation

## 2011-06-16 LAB — URINALYSIS, ROUTINE W REFLEX MICROSCOPIC
Bilirubin Urine: NEGATIVE
Ketones, ur: NEGATIVE
Leukocytes, UA: NEGATIVE
Specific Gravity, Urine: 1.025 (ref 1.000–1.030)
Total Protein, Urine: NEGATIVE
Urine Glucose: NEGATIVE
pH: 5.5 (ref 5.0–8.0)

## 2011-06-16 LAB — CK: Total CK: 73 U/L (ref 7–232)

## 2011-06-16 LAB — CBC WITH DIFFERENTIAL/PLATELET
Basophils Absolute: 0.1 10*3/uL (ref 0.0–0.1)
Eosinophils Absolute: 0.2 10*3/uL (ref 0.0–0.7)
Lymphocytes Relative: 14 % (ref 12.0–46.0)
MCHC: 34.1 g/dL (ref 30.0–36.0)
Monocytes Relative: 10.7 % (ref 3.0–12.0)
Neutrophils Relative %: 71.5 % (ref 43.0–77.0)
RDW: 13.6 % (ref 11.5–14.6)

## 2011-06-16 LAB — LDL CHOLESTEROL, DIRECT: Direct LDL: 107.6 mg/dL

## 2011-06-16 LAB — COMPREHENSIVE METABOLIC PANEL
AST: 34 U/L (ref 0–37)
Albumin: 4.3 g/dL (ref 3.5–5.2)
Alkaline Phosphatase: 68 U/L (ref 39–117)
BUN: 17 mg/dL (ref 6–23)
Calcium: 9.5 mg/dL (ref 8.4–10.5)
Chloride: 102 mEq/L (ref 96–112)
Potassium: 5.5 mEq/L — ABNORMAL HIGH (ref 3.5–5.1)
Sodium: 140 mEq/L (ref 135–145)
Total Protein: 7 g/dL (ref 6.0–8.3)

## 2011-06-16 LAB — LIPID PANEL
Cholesterol: 165 mg/dL (ref 0–200)
Triglycerides: 213 mg/dL — ABNORMAL HIGH (ref 0.0–149.0)

## 2011-06-16 LAB — HEMOGLOBIN A1C: Hgb A1c MFr Bld: 5.5 % (ref 4.6–6.5)

## 2011-06-16 LAB — TSH: TSH: 1.47 u[IU]/mL (ref 0.35–5.50)

## 2011-06-16 NOTE — Progress Notes (Signed)
Subjective:    Patient ID: Bryan Norris, male    DOB: 21-Feb-1925, 78 y.o.   MRN: [id number]  Benign Prostatic Hypertrophy This is a chronic problem. The current episode started more than 1 year ago. The problem is unchanged. Irritative symptoms include urgency. Irritative symptoms do not include frequency or nocturia. Obstructive symptoms do not include dribbling, incomplete emptying, an intermittent stream, a slower stream, straining or a weak stream. Pertinent negatives include no chills, dysuria, genital pain, hematuria, hesitancy, nausea or vomiting. He is not sexually active. The symptoms are aggravated by caffeine. Past treatments include dutasteride and tamsulosin. The treatment provided moderate relief. He has been using treatment for 6 to 12 months.  Hyperlipidemia This is a chronic problem. The current episode started more than 1 year ago. The problem is controlled. Recent lipid tests were reviewed and are variable. He has no history of chronic renal disease, diabetes, hypothyroidism, liver disease, obesity or nephrotic syndrome. Factors aggravating his hyperlipidemia include fatty foods. Pertinent negatives include no chest pain, focal sensory loss, focal weakness, leg pain, myalgias or shortness of breath. Current antihyperlipidemic treatment includes statins. The current treatment provides significant improvement of lipids. There are no compliance problems.       Review of Systems  Constitutional: Negative for fever, chills, diaphoresis, activity change, appetite change, fatigue and unexpected weight change.  HENT: Negative.   Eyes: Negative.   Respiratory: Negative for cough, chest tightness, shortness of breath, wheezing and stridor.   Cardiovascular: Negative for chest pain, palpitations and leg swelling.  Gastrointestinal: Negative for nausea and vomiting.  Genitourinary: Positive for urgency. Negative for dysuria, hesitancy, frequency, hematuria, decreased urine volume, incomplete  emptying and nocturia.  Musculoskeletal: Negative for myalgias, back pain, joint swelling, arthralgias and gait problem.  Skin: Negative for color change, pallor, rash and wound.  Neurological: Negative.  Negative for focal weakness.  Hematological: Negative for adenopathy. Does not bruise/bleed easily.  Psychiatric/Behavioral: Negative for suicidal ideas, hallucinations, behavioral problems, confusion, sleep disturbance, self-injury, dysphoric mood, decreased concentration and agitation. The patient is not nervous/anxious and is not hyperactive.        Objective:   Physical Exam  Vitals reviewed. Constitutional: He is oriented to person, place, and time. He appears well-developed and well-nourished. No distress.  HENT:  Head: Normocephalic and atraumatic.  Eyes: Conjunctivae are normal. Right eye exhibits no discharge. Left eye exhibits no discharge. No scleral icterus.  Neck: Normal range of motion. Neck supple. No JVD present. No tracheal deviation present. No thyromegaly present.  Cardiovascular: Normal rate, regular rhythm, normal heart sounds and intact distal pulses.  Exam reveals no gallop and no friction rub.   No murmur heard. Pulmonary/Chest: Effort normal and breath sounds normal. No stridor. No respiratory distress. He has no wheezes. He has no rales. He exhibits no tenderness.  Abdominal: Soft. Bowel sounds are normal. He exhibits no distension and no mass. There is no tenderness. There is no rebound and no guarding. Hernia confirmed negative in the right inguinal area and confirmed negative in the left inguinal area.  Genitourinary: Rectum normal and testes normal. Rectal exam shows no external hemorrhoid, no internal hemorrhoid, no fissure, no mass, no tenderness and anal tone normal. Guaiac negative stool. Prostate is enlarged (2+ smooth bilateral hypertrophy with no nodules). Prostate is not tender. Cremasteric reflex is present. Right testis shows no mass, no swelling and no  tenderness. Right testis is descended. Left testis shows no mass, no swelling and no tenderness. Left testis is descended. Circumcised.  No penile tenderness. No discharge found.  Musculoskeletal: Normal range of motion. He exhibits no edema and no tenderness.  Lymphadenopathy:    He has no cervical adenopathy.       Right: No inguinal adenopathy present.       Left: No inguinal adenopathy present.  Neurological: He is oriented to person, place, and time.  Skin: Skin is warm and dry. No rash noted. He is not diaphoretic. No erythema. No pallor.  Psychiatric: He has a normal mood and affect. Judgment and thought content normal. His mood appears not anxious. His affect is not angry, not blunt, not labile and not inappropriate. His speech is delayed and tangential. His speech is not rapid and/or pressured and not slurred. He is slowed. He is not agitated, not aggressive, is not hyperactive, not withdrawn, not actively hallucinating and not combative. Thought content is not paranoid and not delusional. Cognition and memory are impaired. He does not express impulsivity or inappropriate judgment. He does not exhibit a depressed mood. He expresses no homicidal and no suicidal ideation. He expresses no suicidal plans and no homicidal plans. He is communicative. He is inattentive.      Lab Results  Component Value Date   WBC 9.2 03/20/2011   HGB 17.0 03/20/2011   HCT 48.6 03/20/2011   PLT 220 03/20/2011   GLUCOSE 106* 03/20/2011   CHOL 151 02/02/2011   TRIG 116.0 02/02/2011   HDL 39.20 02/02/2011   LDLDIRECT 216.7 11/04/2009   LDLCALC 89 02/02/2011   ALT 22 03/20/2011   AST 22 03/20/2011   NA 138 03/20/2011   K 4.7 03/20/2011   CL 98 03/20/2011   CREATININE 1.03 03/20/2011   BUN 19 03/20/2011   CO2 32 03/20/2011   TSH 1.16 11/04/2009   PSA 2.65 02/02/2011      Assessment & Plan:

## 2011-06-16 NOTE — Patient Instructions (Signed)

## 2011-06-17 ENCOUNTER — Encounter: Payer: Self-pay | Admitting: Internal Medicine

## 2011-06-17 NOTE — Assessment & Plan Note (Signed)
a1c today 

## 2011-06-17 NOTE — Assessment & Plan Note (Signed)
PSA was recently normal, he was previously prescribed Jalyn but he has decided not to take it due to a lack of symptoms

## 2011-06-17 NOTE — Assessment & Plan Note (Signed)
I will check his FLP and CMP today 

## 2011-06-17 NOTE — Assessment & Plan Note (Signed)
He is not compliant with actonel therapy

## 2011-06-17 NOTE — Assessment & Plan Note (Signed)
TSH today, he is not currently on replacement therapy

## 2011-07-12 DIAGNOSIS — L570 Actinic keratosis: Secondary | ICD-10-CM | POA: Diagnosis not present

## 2011-07-12 DIAGNOSIS — L219 Seborrheic dermatitis, unspecified: Secondary | ICD-10-CM | POA: Diagnosis not present

## 2011-07-12 DIAGNOSIS — L719 Rosacea, unspecified: Secondary | ICD-10-CM | POA: Diagnosis not present

## 2012-01-15 ENCOUNTER — Telehealth: Payer: Self-pay | Admitting: Internal Medicine

## 2012-01-15 NOTE — Telephone Encounter (Signed)
Patient would like to know if he needs any labs drawn before his 6 month follow up visit

## 2012-01-16 NOTE — Telephone Encounter (Signed)
Medicare does not allow labs prior to a visit

## 2012-01-16 NOTE — Telephone Encounter (Signed)
Patient notified

## 2012-01-18 ENCOUNTER — Encounter: Payer: Self-pay | Admitting: Internal Medicine

## 2012-01-18 ENCOUNTER — Ambulatory Visit (INDEPENDENT_AMBULATORY_CARE_PROVIDER_SITE_OTHER): Payer: Medicare Other | Admitting: Internal Medicine

## 2012-01-18 VITALS — BP 136/82 | HR 82 | Temp 98.0°F | Resp 16 | Wt 176.2 lb

## 2012-01-18 DIAGNOSIS — R32 Unspecified urinary incontinence: Secondary | ICD-10-CM

## 2012-01-18 DIAGNOSIS — R7309 Other abnormal glucose: Secondary | ICD-10-CM

## 2012-01-18 DIAGNOSIS — K219 Gastro-esophageal reflux disease without esophagitis: Secondary | ICD-10-CM

## 2012-01-18 DIAGNOSIS — E785 Hyperlipidemia, unspecified: Secondary | ICD-10-CM | POA: Diagnosis not present

## 2012-01-18 DIAGNOSIS — J309 Allergic rhinitis, unspecified: Secondary | ICD-10-CM

## 2012-01-18 DIAGNOSIS — E039 Hypothyroidism, unspecified: Secondary | ICD-10-CM

## 2012-01-18 DIAGNOSIS — M810 Age-related osteoporosis without current pathological fracture: Secondary | ICD-10-CM

## 2012-01-18 MED ORDER — DARIFENACIN HYDROBROMIDE ER 7.5 MG PO TB24: 7.5000 mg | ORAL_TABLET | Freq: Every day | ORAL | Status: DC

## 2012-01-18 MED ORDER — FLUTICASONE PROPIONATE 50 MCG/ACT NA SUSP: 2.0000 | Freq: Every day | NASAL | Status: AC

## 2012-01-18 MED ORDER — ROSUVASTATIN CALCIUM 10 MG PO TABS: 10.0000 mg | ORAL_TABLET | Freq: Every day | ORAL | Status: DC

## 2012-01-18 MED ORDER — ESOMEPRAZOLE MAGNESIUM 40 MG PO CPDR
40.0000 mg | DELAYED_RELEASE_CAPSULE | Freq: Every day | ORAL | Status: DC
Start: 2012-01-18 — End: 2013-05-08

## 2012-01-18 NOTE — Assessment & Plan Note (Signed)
Start flonase ns 

## 2012-01-18 NOTE — Assessment & Plan Note (Signed)
He needs an updated DEXA scan

## 2012-01-18 NOTE — Assessment & Plan Note (Signed)
He is doing well on crestor 

## 2012-01-18 NOTE — Assessment & Plan Note (Signed)
Will treat with nexium 

## 2012-01-18 NOTE — Assessment & Plan Note (Signed)
Continue enablex 

## 2012-01-18 NOTE — Patient Instructions (Signed)

## 2012-01-18 NOTE — Progress Notes (Signed)
Subjective:    Patient ID: Bryan Norris, male    DOB: [birth year], 78 y.o.   MRN: [id number]  Gastrophageal Reflux He complains of heartburn. He reports no abdominal pain, no belching, no chest pain, no choking, no coughing, no dysphagia, no early satiety, no globus sensation, no hoarse voice, no nausea, no sore throat, no stridor, no tooth decay, no water brash or no wheezing. This is a chronic problem. The current episode started more than 1 year ago. The problem occurs frequently. The problem has been unchanged. The heartburn is located in the substernum. The heartburn is of mild intensity. The heartburn does not wake him from sleep. The heartburn does not limit his activity. The heartburn doesn't change with position. Nothing aggravates the symptoms. Pertinent negatives include no anemia, fatigue, melena, muscle weakness, orthopnea or weight loss. He has tried nothing for the symptoms. Past procedures include an EGD.      Review of Systems  Constitutional: Negative for fever, chills, weight loss, diaphoresis, activity change, appetite change, fatigue and unexpected weight change.  HENT: Positive for congestion, rhinorrhea, sneezing and postnasal drip. Negative for ear pain, nosebleeds, sore throat, hoarse voice, trouble swallowing, dental problem, voice change and sinus pressure.   Eyes: Negative.  Negative for discharge.  Respiratory: Negative for apnea, cough, choking, chest tightness, shortness of breath, wheezing and stridor.   Cardiovascular: Negative for chest pain, palpitations and leg swelling.  Gastrointestinal: Positive for heartburn. Negative for dysphagia, nausea, abdominal pain and melena.  Genitourinary: Negative.   Musculoskeletal: Negative for myalgias, back pain, joint swelling, arthralgias, gait problem and muscle weakness.  Skin: Negative.   Neurological: Negative.   Hematological: Negative for adenopathy. Does not bruise/bleed easily.  Psychiatric/Behavioral: Negative.         Objective:   Physical Exam  Vitals reviewed. Constitutional: He is oriented to person, place, and time. He appears well-developed and well-nourished. No distress.  HENT:  Head: Normocephalic and atraumatic.  Nose: Nose normal.  Mouth/Throat: Oropharynx is clear and moist. No oropharyngeal exudate.  Eyes: Conjunctivae normal are normal. Right eye exhibits no discharge. Left eye exhibits no discharge. No scleral icterus.  Neck: Normal range of motion. Neck supple. No JVD present. No tracheal deviation present. No thyromegaly present.  Cardiovascular: Normal rate, regular rhythm, normal heart sounds and intact distal pulses.  Exam reveals no gallop and no friction rub.   No murmur heard. Pulmonary/Chest: Effort normal and breath sounds normal. No stridor. No respiratory distress. He has no wheezes. He has no rales. He exhibits no tenderness.  Abdominal: Soft. Bowel sounds are normal. He exhibits no distension and no mass. There is no tenderness. There is no rebound and no guarding.  Musculoskeletal: Normal range of motion. He exhibits no edema and no tenderness.  Lymphadenopathy:    He has no cervical adenopathy.  Neurological: He is oriented to person, place, and time.  Skin: Skin is warm and dry. No rash noted. He is not diaphoretic. No erythema. No pallor.  Psychiatric: He has a normal mood and affect. His behavior is normal. Judgment and thought content normal.      Lab Results  Component Value Date   WBC 7.5 06/16/2011   HGB 16.8 06/16/2011   HCT 49.1 06/16/2011   PLT 228.0 06/16/2011   GLUCOSE 200* 06/16/2011   CHOL 165 06/16/2011   TRIG 213.0* 06/16/2011   HDL 40.90 06/16/2011   LDLDIRECT 107.6 06/16/2011   LDLCALC 89 02/02/2011   ALT 39 06/16/2011   AST 34  06/16/2011   NA 140 06/16/2011   K 5.5* 06/16/2011   CL 102 06/16/2011   CREATININE 1.2 06/16/2011   BUN 17 06/16/2011   CO2 31 06/16/2011   TSH 1.47 06/16/2011   PSA 2.65 02/02/2011   HGBA1C 5.5 06/16/2011      Assessment & Plan:

## 2012-01-25 ENCOUNTER — Other Ambulatory Visit: Payer: Self-pay | Admitting: Internal Medicine

## 2012-01-25 MED ORDER — SOLIFENACIN SUCCINATE 10 MG PO TABS: 10.0000 mg | ORAL_TABLET | Freq: Every day | ORAL | Status: DC

## 2012-03-22 ENCOUNTER — Ambulatory Visit (HOSPITAL_BASED_OUTPATIENT_CLINIC_OR_DEPARTMENT_OTHER): Payer: Medicare Other | Admitting: Family

## 2012-03-22 ENCOUNTER — Encounter: Payer: Self-pay | Admitting: Family

## 2012-03-22 ENCOUNTER — Other Ambulatory Visit (HOSPITAL_BASED_OUTPATIENT_CLINIC_OR_DEPARTMENT_OTHER): Payer: Medicare Other

## 2012-03-22 VITALS — BP 158/87 | HR 67 | Temp 98.3°F | Resp 20 | Ht 67.0 in | Wt 177.7 lb

## 2012-03-22 DIAGNOSIS — M810 Age-related osteoporosis without current pathological fracture: Secondary | ICD-10-CM

## 2012-03-22 DIAGNOSIS — D472 Monoclonal gammopathy: Secondary | ICD-10-CM

## 2012-03-22 DIAGNOSIS — E039 Hypothyroidism, unspecified: Secondary | ICD-10-CM | POA: Diagnosis not present

## 2012-03-22 LAB — CBC WITH DIFFERENTIAL/PLATELET
Eosinophils Absolute: 0.3 10*3/uL (ref 0.0–0.5)
MONO#: 0.9 10*3/uL (ref 0.1–0.9)
NEUT#: 6.3 10*3/uL (ref 1.5–6.5)
RBC: 4.87 10*6/uL (ref 4.20–5.82)
RDW: 13.2 % (ref 11.0–14.6)
WBC: 8.7 10*3/uL (ref 4.0–10.3)

## 2012-03-22 LAB — COMPREHENSIVE METABOLIC PANEL (CC13)
Alkaline Phosphatase: 77 U/L (ref 40–150)
Creatinine: 1 mg/dL (ref 0.7–1.3)
Glucose: 98 mg/dl (ref 70–99)
Sodium: 141 mEq/L (ref 136–145)
Total Bilirubin: 0.55 mg/dL (ref 0.20–1.20)
Total Protein: 7.2 g/dL (ref 6.4–8.3)

## 2012-03-22 LAB — LACTATE DEHYDROGENASE (CC13): LDH: 175 U/L (ref 125–245)

## 2012-03-22 NOTE — Progress Notes (Signed)
Patient ID: Bryan Norris, male   DOB: [birth year], 78 y.o.   MRN: [id number] CSN: [id number]  CC: Bryan Savoy, MD Bryan Grandchild, MD   Problem List: Bryan Norris is a 78 y.o. Caucasian male with a problem list consisting of:  1. Monoclonal gammopathy of uncertain significance detected in October 2005. Urine immunofixation electrophoresis in March 2006 was negative. The patient has not had a bone marrow and his quantitative immunoglobulins have been stable.  2. Hypothyroidism 3. Vitamin D deficiency 4. Dyslipidemia 5. Allergic rhinitis 6. GERD 7. Polymyalgia rheumatica 8. Osteoporosis 9. Urinary incontinence 10.History of colonic polyps 11.History of diverticulitis 12.BPH 13.Rosacea.  Dr. Arline Norris and I saw Bryan Norris today for followup of his IgG kappa monoclonal gammopathy first detected in October 2005. Bryan Norris was last seen by [place] on 03/19/2011. There has been little change in his condition. Bryan Norris is now 78 years old., he remains active, lives alone and is very independent. 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  He is without major complaint today, but does state that the arthritis in his hands give him trouble occasionally.  He denies any other symptomatology.  There has really been no change in his condition and no symptoms to suggest multiple myeloma.  Past Medical History: Past Medical History  Diagnosis Date  . Arthritis   . Blood in stool   . High cholesterol   . FH: colonic polyps   . Diverticulitis   . Urinary incontinence   . Esophageal stricture   . GI bleed   . IBS (irritable bowel syndrome)   . MGUS (monoclonal gammopathy of unknown significance) 01/2004  . Dyslipidemia   . Rosacea 2009    Surgical History: Past Surgical History  Procedure Date  . Inguinal hernia repair   . Rotator cuff repair   . Diviated septum   . Tonsillectomy     Current Medications: Current Outpatient Prescriptions  Medication Sig Dispense Refill  . aspirin  81 MG tablet Take 81 mg by mouth daily.        . Cholecalciferol (VITAMIN D) 2000 UNITS CAPS Take 1 capsule by mouth daily.      Marland Kitchen darifenacin (ENABLEX) 7.5 MG 24 hr tablet Take 7.5 mg by mouth daily.      Marland Kitchen esomeprazole (NEXIUM) 40 MG capsule Take 1 capsule (40 mg total) by mouth daily.  50 capsule  0  . fish oil-omega-3 fatty acids 1000 MG capsule Take 1 g by mouth daily.        . fluticasone (FLONASE) 50 MCG/ACT nasal spray Place 2 sprays into the nose daily.  16 g  11  . glucosamine-chondroitin 500-400 MG tablet Take 1 tablet by mouth daily.      . minocycline (MINOCIN,DYNACIN) 50 MG capsule Take 50 mg by mouth 2 (two) times daily.      . Multiple Vitamins-Minerals (ICAPS) CAPS Take 1 capsule by mouth 2 (two) times daily.      Bertram Gala Glycol-Propyl Glycol (SYSTANE) 0.4-0.3 % SOLN Apply 1 drop to eye as needed.      . rosuvastatin (CRESTOR) 10 MG tablet Take 1 tablet (10 mg total) by mouth daily.  90 tablet  3    Allergies: Allergies  Allergen Reactions  . Tetanus Toxoids     Fever and chills    Family History: Family History  Problem Relation Age of Onset  . Heart disease Mother   . GI Bleed Father   . Heart disease  Brother   . Heart disease Brother     Social History: History  Substance Use Topics  . Smoking status: Former Games developer  . Smokeless tobacco: Never Used  . Alcohol Use: No     Comment: one daily     Review of Systems: 10 Point review of systems was completed and is negative except as noted above.   Physical Exam:   Blood pressure 158/87, pulse 67, temperature 98.3 F (36.8 C), temperature source Oral, resp. rate 20, height 5\' 7"  (1.702 m), weight 177 lb 11.2 oz (80.604 kg).  General appearance: Alert, cooperative, well nourished, no apparent distress Head: Normocephalic, without obvious abnormality, atraumatic Eyes: Conjunctivae clear, arcus senilis,  PERRLA, EOMI Nose: Nares, septum and mucosa are normal, no drainage or sinus tenderness Neck: No  adenopathy, supple, symmetrical, trachea midline, thyroid not enlarged, no tenderness Resp: Clear to auscultation bilaterally Cardio: Regular rate and rhythm, S1, S2 normal, no murmur, click, rub or gallop GI: Soft, distended, non-tender, hypoactive bowel sounds, no organomegaly Extremities: Extremities normal, atraumatic, no cyanosis or edema Skin: Diffuse rosacea on cheeks Lymph nodes: Cervical, supraclavicular, and axillary nodes normal Neurologic: Grossly normal   Laboratory Data: Results for orders placed in visit on 03/22/12 (from the past 48 hour(s))  CBC WITH DIFFERENTIAL     Status: Abnormal   Collection Time   03/22/12  3:27 PM      Component Value Range Comment   WBC 8.7  4.0 - 10.3 10e3/uL    NEUT# 6.3  1.5 - 6.5 10e3/uL    HGB 15.7  13.0 - 17.1 g/dL    HCT 16.1  09.6 - 04.5 %    Platelets 226  140 - 400 10e3/uL    MCV 94.0  79.3 - 98.0 fL    MCH 32.2  27.2 - 33.4 pg    MCHC 34.3  32.0 - 36.0 g/dL    RBC 4.09  8.11 - 9.14 10e6/uL    RDW 13.2  11.0 - 14.6 %    lymph# 1.2  0.9 - 3.3 10e3/uL    MONO# 0.9  0.1 - 0.9 10e3/uL    Eosinophils Absolute 0.3  0.0 - 0.5 10e3/uL    Basophils Absolute 0.1  0.0 - 0.1 10e3/uL    NEUT% 72.4  39.0 - 75.0 %    LYMPH% 13.8 (*) 14.0 - 49.0 %    MONO% 9.9  0.0 - 14.0 %    EOS% 3.3  0.0 - 7.0 %    BASO% 0.6  0.0 - 2.0 %   LACTATE DEHYDROGENASE (CC13)     Status: Normal   Collection Time   03/22/12  3:27 PM      Component Value Range Comment   LDH 175  125 - 245 U/L 02/22/12 - NOTE new reference range.  COMPREHENSIVE METABOLIC PANEL (CC13)     Status: Normal   Collection Time   03/22/12  3:27 PM      Component Value Range Comment   Sodium 141  136 - 145 mEq/L    Potassium 4.4  3.5 - 5.1 mEq/L    Chloride 105  98 - 107 mEq/L    CO2 28  22 - 29 mEq/L    Glucose 98  70 - 99 mg/dl    BUN 78.2  7.0 - 95.6 mg/dL    Creatinine 1.0  0.7 - 1.3 mg/dL    Total Bilirubin 2.13  0.20 - 1.20 mg/dL    Alkaline Phosphatase 77  40 - 150  U/L    AST  27  5 - 34 U/L    ALT 33  0 - 55 U/L    Total Protein 7.2  6.4 - 8.3 g/dL    Albumin 3.9  3.5 - 5.0 g/dL    Calcium 9.4  8.4 - 16.1 mg/dL      Imaging Studies: 1.  Chest x-ray 2 view on 07/13/2010 showed no acute cardiopulmonary abnormalities.   Impression/Plan: Bryan Norris continues to do well now over 8 years from the time of detection of his monoclonal gammopathy that is most likely benign. Dr. Arline Norris gave Bryan Norris the option to see [place] again in 1 year or to follow up with [place] on an as needed basis.  Bryan Norris opted for the latter and  would like to come see [place] on an as needed basis.  He states that he will follow up with his PCP, Dr. Yetta Barre regarding his decision and for continued monitoring.  Bryan Norris is encouraged to contact us if he has any questions or concerns.   Larina Bras, NP-C 03/22/2012, 5:45 PM

## 2012-03-22 NOTE — Patient Instructions (Addendum)
Please contact us at [phone] if you have any questions or concerns. 

## 2012-03-23 LAB — IGG, IGA, IGM
IgA: 169 mg/dL (ref 68–379)
IgG (Immunoglobin G), Serum: 901 mg/dL (ref 650–1600)

## 2012-03-25 ENCOUNTER — Telehealth: Payer: Self-pay | Admitting: Oncology

## 2012-03-25 NOTE — Telephone Encounter (Signed)
Per 12/6 pof pt will f/u on an as needed basis.

## 2012-07-25 DIAGNOSIS — Z961 Presence of intraocular lens: Secondary | ICD-10-CM | POA: Diagnosis not present

## 2012-07-25 DIAGNOSIS — H353190 Nonexudative age-related macular degeneration, unspecified eye, stage unspecified: Secondary | ICD-10-CM | POA: Diagnosis not present

## 2012-07-25 DIAGNOSIS — H43819 Vitreous degeneration, unspecified eye: Secondary | ICD-10-CM | POA: Diagnosis not present

## 2012-10-03 DIAGNOSIS — B029 Zoster without complications: Secondary | ICD-10-CM | POA: Diagnosis not present

## 2013-01-14 ENCOUNTER — Other Ambulatory Visit: Payer: Self-pay | Admitting: Internal Medicine

## 2013-04-16 ENCOUNTER — Other Ambulatory Visit: Payer: Self-pay | Admitting: Internal Medicine

## 2013-05-08 ENCOUNTER — Encounter: Payer: Self-pay | Admitting: Internal Medicine

## 2013-05-08 ENCOUNTER — Ambulatory Visit (INDEPENDENT_AMBULATORY_CARE_PROVIDER_SITE_OTHER): Payer: Medicare Other | Admitting: Internal Medicine

## 2013-05-08 ENCOUNTER — Other Ambulatory Visit (INDEPENDENT_AMBULATORY_CARE_PROVIDER_SITE_OTHER): Payer: Medicare Other

## 2013-05-08 VITALS — BP 124/78 | HR 64 | Temp 97.8°F | Resp 16 | Ht 67.0 in | Wt 171.2 lb

## 2013-05-08 DIAGNOSIS — E559 Vitamin D deficiency, unspecified: Secondary | ICD-10-CM | POA: Diagnosis not present

## 2013-05-08 DIAGNOSIS — E785 Hyperlipidemia, unspecified: Secondary | ICD-10-CM

## 2013-05-08 DIAGNOSIS — Z23 Encounter for immunization: Secondary | ICD-10-CM | POA: Diagnosis not present

## 2013-05-08 DIAGNOSIS — N400 Enlarged prostate without lower urinary tract symptoms: Secondary | ICD-10-CM | POA: Diagnosis not present

## 2013-05-08 DIAGNOSIS — M810 Age-related osteoporosis without current pathological fracture: Secondary | ICD-10-CM | POA: Diagnosis not present

## 2013-05-08 DIAGNOSIS — K219 Gastro-esophageal reflux disease without esophagitis: Secondary | ICD-10-CM

## 2013-05-08 DIAGNOSIS — Z0000 Encounter for general adult medical examination without abnormal findings: Secondary | ICD-10-CM | POA: Diagnosis not present

## 2013-05-08 DIAGNOSIS — Z2911 Encounter for prophylactic immunotherapy for respiratory syncytial virus (RSV): Secondary | ICD-10-CM

## 2013-05-08 DIAGNOSIS — D472 Monoclonal gammopathy: Secondary | ICD-10-CM | POA: Diagnosis not present

## 2013-05-08 LAB — URINALYSIS, ROUTINE W REFLEX MICROSCOPIC
BILIRUBIN URINE: NEGATIVE
Hgb urine dipstick: NEGATIVE
Ketones, ur: NEGATIVE
Nitrite: NEGATIVE
RBC / HPF: NONE SEEN (ref 0–?)
Specific Gravity, Urine: 1.01 (ref 1.000–1.030)
Total Protein, Urine: NEGATIVE
UROBILINOGEN UA: 0.2 (ref 0.0–1.0)
Urine Glucose: NEGATIVE
pH: 6.5 (ref 5.0–8.0)

## 2013-05-08 LAB — COMPREHENSIVE METABOLIC PANEL
ALT: 29 U/L (ref 0–53)
AST: 29 U/L (ref 0–37)
Albumin: 4.6 g/dL (ref 3.5–5.2)
Alkaline Phosphatase: 67 U/L (ref 39–117)
BILIRUBIN TOTAL: 1 mg/dL (ref 0.3–1.2)
BUN: 13 mg/dL (ref 6–23)
CO2: 31 mEq/L (ref 19–32)
CREATININE: 1.2 mg/dL (ref 0.4–1.5)
Calcium: 9.9 mg/dL (ref 8.4–10.5)
Chloride: 102 mEq/L (ref 96–112)
GFR: 62.7 mL/min (ref >60.00)
Glucose, Bld: 99 mg/dL (ref 70–99)
Potassium: 5.5 mEq/L — ABNORMAL HIGH (ref 3.5–5.1)
Sodium: 141 mEq/L (ref 135–145)
Total Protein: 7.4 g/dL (ref 6.0–8.3)

## 2013-05-08 LAB — PSA: PSA: 8.31 ng/mL — ABNORMAL HIGH (ref 0.10–4.00)

## 2013-05-08 LAB — LIPID PANEL
CHOLESTEROL: 156 mg/dL (ref 0–200)
HDL: 40.3 mg/dL (ref >39.00)
LDL CALC: 88 mg/dL (ref 0–99)
TRIGLYCERIDES: 141 mg/dL (ref 0.0–149.0)
Total CHOL/HDL Ratio: 4
VLDL: 28.2 mg/dL (ref 0.0–40.0)

## 2013-05-08 LAB — CBC WITH DIFFERENTIAL/PLATELET
BASOS ABS: 0 10*3/uL (ref 0.0–0.1)
Basophils Relative: 0.3 % (ref 0.0–3.0)
EOS ABS: 0.1 10*3/uL (ref 0.0–0.7)
Eosinophils Relative: 1.3 % (ref 0.0–5.0)
HEMATOCRIT: 49.4 % (ref 39.0–52.0)
HEMOGLOBIN: 17.2 g/dL — AB (ref 13.0–17.0)
LYMPHS ABS: 1.1 10*3/uL (ref 0.7–4.0)
Lymphocytes Relative: 11.3 % — ABNORMAL LOW (ref 12.0–46.0)
MCHC: 34.8 g/dL (ref 30.0–36.0)
MCV: 92.2 fl (ref 78.0–100.0)
MONO ABS: 0.7 10*3/uL (ref 0.1–1.0)
Monocytes Relative: 7.9 % (ref 3.0–12.0)
Neutro Abs: 7.5 10*3/uL (ref 1.4–7.7)
Neutrophils Relative %: 79.2 % — ABNORMAL HIGH (ref 43.0–77.0)
Platelets: 232 10*3/uL (ref 150.0–400.0)
RBC: 5.36 Mil/uL (ref 4.22–5.81)
RDW: 13.6 % (ref 11.5–14.6)
WBC: 9.5 10*3/uL (ref 4.5–10.5)

## 2013-05-08 LAB — CK: CK TOTAL: 78 U/L (ref 7–232)

## 2013-05-08 LAB — TSH: TSH: 1.99 u[IU]/mL (ref 0.35–5.50)

## 2013-05-08 LAB — FECAL OCCULT BLOOD, GUAIAC: FECAL OCCULT BLD: NEGATIVE

## 2013-05-08 MED ORDER — ROSUVASTATIN CALCIUM 10 MG PO TABS: 10.0000 mg | ORAL_TABLET | Freq: Every day | ORAL | Status: DC

## 2013-05-08 MED ORDER — ESOMEPRAZOLE MAGNESIUM 40 MG PO CPDR: 40.0000 mg | DELAYED_RELEASE_CAPSULE | Freq: Every day | ORAL | Status: DC

## 2013-05-08 MED ORDER — CHOLECALCIFEROL 1.25 MG (50000 UT) PO TABS: 1.0000 | ORAL_TABLET | ORAL | Status: DC

## 2013-05-08 NOTE — Assessment & Plan Note (Signed)
I will check his CPK FLP CMP and TSH today

## 2013-05-08 NOTE — Assessment & Plan Note (Signed)
The software blocked me from ordering a Vit D level so I have asked him to start a Vit D weekly supplement

## 2013-05-08 NOTE — Assessment & Plan Note (Signed)
PSA today

## 2013-05-08 NOTE — Patient Instructions (Signed)
Health Maintenance, Males A healthy lifestyle and preventative care can promote health and wellness.  Maintain regular health, dental, and eye exams.  Eat a healthy diet. Foods like vegetables, fruits, whole grains, low-fat dairy products, and lean protein foods contain the nutrients you need and are low in calories. Decrease your intake of foods high in solid fats, added sugars, and salt. Get information about a proper diet from your health care provider, if necessary.  Regular physical exercise is one of the most important things you can do for your health. Most adults should get at least 150 minutes of moderate-intensity exercise (any activity that increases your heart rate and causes you to sweat) each week. In addition, most adults need muscle-strengthening exercises on 2 or more days a week.   Maintain a healthy weight. The body mass index (BMI) is a screening tool to identify possible weight problems. It provides an estimate of body fat based on height and weight. Your health care provider can find your BMI and can help you achieve or maintain a healthy weight. For males 20 years and older:  A BMI below 18.5 is considered underweight.  A BMI of 18.5 to 24.9 is normal.  A BMI of 25 to 29.9 is considered overweight.  A BMI of 30 and above is considered obese.  Maintain normal blood lipids and cholesterol by exercising and minimizing your intake of saturated fat. Eat a balanced diet with plenty of fruits and vegetables. Blood tests for lipids and cholesterol should begin at age 20 and be repeated every 5 years. If your lipid or cholesterol levels are high, you are over 50, or you are at high risk for heart disease, you may need your cholesterol levels checked more frequently.Ongoing high lipid and cholesterol levels should be treated with medicines, if diet and exercise are not working.  If you smoke, find out from your health care provider how to quit. If you do not use tobacco, do not  start.  Lung cancer screening is recommended for adults aged 55 80 years who are at high risk for developing lung cancer because of a history of smoking. A yearly low-dose CT scan of the lungs is recommended for people who have at least a 30-pack-year history of smoking and are a current smoker or have quit within the past 15 years. A pack year of smoking is smoking an average of 1 pack of cigarettes a day for 1 year (for example, a 30-pack-year history of smoking could mean smoking 1 pack a day for 30 years or 2 packs a day for 15 years). Yearly screening should continue until the smoker has stopped smoking for at least 15 years. Yearly screening should be stopped for people who develop a health problem that would prevent them from having lung cancer treatment.  If you choose to drink alcohol, do not have more than 2 drinks per day. One drink is considered to be 12 oz (360 mL) of beer, 5 oz (150 mL) of wine, or 1.5 oz (45 mL) of liquor.  Avoid use of street drugs. Do not share needles with anyone. Ask for help if you need support or instructions about stopping the use of drugs.  High blood pressure causes heart disease and increases the risk of stroke. Blood pressure should be checked at least every 1 2 years. Ongoing high blood pressure should be treated with medicines if weight loss and exercise are not effective.  If you are 45 79 years old, ask your health   care provider if you should take aspirin to prevent heart disease.  Diabetes screening involves taking a blood sample to check your fasting blood sugar level. This should be done once every 3 years after age 45, if you are at a normal weight and without risk factors for diabetes. Testing should be considered at a younger age or be carried out more frequently if you are overweight and have at least 1 risk factor for diabetes.  Colorectal cancer can be detected and often prevented. Most routine colorectal cancer screening begins at the age of 50  and continues through age 75. However, your health care provider may recommend screening at an earlier age if you have risk factors for colon cancer. On a yearly basis, your health care provider may provide home test kits to check for hidden blood in the stool. A small camera at the end of a tube may be used to directly examine the colon (sigmoidoscopy or colonoscopy) to detect the earliest forms of colorectal cancer. Talk to your health care provider about this at age 50, when routine screening begins. A direct exam of the colon should be repeated every 5 10 years through age 75, unless early forms of pre-cancerous polyps or small growths are found.  People who are at an increased risk for hepatitis B should be screened for this virus. You are considered at high risk for hepatitis B if:  You were born in a country where hepatitis B occurs often. Talk with your health care provider about which countries are considered high-risk.  Your parents were born in a high-risk country and you have not received a shot to protect against hepatitis B (hepatitis B vaccine).  You have HIV or AIDS.  You use needles to inject street drugs.  You live with, or have sex with, someone who has hepatitis B.  You are a man who has sex with other men (MSM).  You get hemodialysis treatment.  You take certain medicines for conditions like cancer, organ transplantation, and autoimmune conditions.  Hepatitis C blood testing is recommended for all people born from 1945 through 1965 and any individual with known risk factors for hepatitis C.  Healthy men should no longer receive prostate-specific antigen (PSA) blood tests as part of routine cancer screening. Talk to your health care provider about prostate cancer screening.  Testicular cancer screening is not recommended for adolescents or adult males who have no symptoms. Screening includes self-exam, a health care provider exam, and other screening tests. Consult with  your health care provider about any symptoms you have or any concerns you have about testicular cancer.  Practice safe sex. Use condoms and avoid high-risk sexual practices to reduce the spread of sexually transmitted infections (STIs).  Use sunscreen. Apply sunscreen liberally and repeatedly throughout the day. You should seek shade when your shadow is shorter than you. Protect yourself by wearing long sleeves, pants, a wide-brimmed hat, and sunglasses year round, whenever you are outdoors.  Tell your health care provider of new moles or changes in moles, especially if there is a change in shape or color. Also tell your provider if a mole is larger than the size of a pencil eraser.  A one-time screening for abdominal aortic aneurysm (AAA) and surgical repair of large AAAs by ultrasound is recommended for men aged 65 75 years who are current or former smokers.  Stay current with your vaccines (immunizations). Document Released: 09/30/2007 Document Revised: 01/22/2013 Document Reviewed: 08/29/2010 [employer] Patient Information 2014 [employer].   Hypercholesterolemia High Blood Cholesterol Cholesterol is a white, waxy, fat-like protein needed by your body in small amounts. The liver makes all the cholesterol you need. It is carried from the liver by the blood through the blood vessels. Deposits (plaque) may build up on blood vessel walls. This makes the arteries narrower and stiffer. Plaque increases the risk for heart attack and stroke. You cannot feel your cholesterol level even if it is very high. The only way to know is by a blood test to check your lipid (fats) levels. Once you know your cholesterol levels, you should keep a record of the test results. Work with your caregiver to to keep your levels in the desired range. WHAT THE RESULTS MEAN:  Total cholesterol is a rough measure of all the cholesterol in your blood.  LDL is the so-called bad cholesterol. This is the type that deposits  cholesterol in the walls of the arteries. You want this level to be low.  HDL is the good cholesterol because it cleans the arteries and carries the LDL away. You want this level to be high.  Triglycerides are fat that the body can either burn for energy or store. High levels are closely linked to heart disease. DESIRED LEVELS:  Total cholesterol below 200.  LDL below 100 for people at risk, below 70 for very high risk.  HDL above 50 is good, above 60 is best.  Triglycerides below 150. HOW TO LOWER YOUR CHOLESTEROL:  Diet.  Choose fish or white meat chicken and [place], roasted or baked. Limit fatty cuts of red meat, fried foods, and processed meats, such as sausage and lunch meat.  Eat lots of fresh fruits and vegetables. Choose whole grains, beans, pasta, potatoes and cereals.  Use only small amounts of olive, corn or canola oils. Avoid butter, mayonnaise, shortening or palm kernel oils. Avoid foods with trans-fats.  Use skim/nonfat milk and low-fat/nonfat yogurt and cheeses. Avoid whole milk, cream, ice cream, egg yolks and cheeses. Healthy desserts include angel food cake, gingersnaps, animal crackers, hard candy, popsicles, and low-fat/nonfat frozen yogurt. Avoid pastries, cakes, pies and cookies.  Exercise.  A regular program helps decrease LDL and raises HDL.  Helps with weight control.  Do things that increase your activity level like gardening, walking, or taking the stairs.  Medication.  May be prescribed by your caregiver to help lowering cholesterol and the risk for heart disease.  You may need medicine even if your levels are normal if you have several risk factors. HOME CARE INSTRUCTIONS   Follow your diet and exercise programs as suggested by your caregiver.  Take medications as directed.  Have blood work done when your caregiver feels it is necessary. MAKE SURE YOU:   Understand these instructions.  Will watch your condition.  Will get help right  away if you are not doing well or get worse. Document Released: 04/03/2005 Document Revised: 06/26/2011 Document Reviewed: 09/19/2006 [facility] Patient Information 2014 [place].

## 2013-05-08 NOTE — Addendum Note (Signed)
Addended by: Estell Harpin T on: 05/08/2013 01:41 PM   Modules accepted: Orders

## 2013-05-08 NOTE — Progress Notes (Signed)
Pre visit review using our clinic review tool, if applicable. No additional management support is needed unless otherwise documented below in the visit note. 

## 2013-05-08 NOTE — Progress Notes (Signed)
Subjective:    Patient ID: Bryan Norris, male    DOB: Aug 27, 1924, 80 y.o.   MRN: [id number]  Hyperlipidemia This is a chronic problem. The current episode started more than 1 year ago. The problem is controlled. Recent lipid tests were reviewed and are variable. He has no history of chronic renal disease, diabetes, hypothyroidism, liver disease, obesity or nephrotic syndrome. Factors aggravating his hyperlipidemia include fatty foods. Pertinent negatives include no chest pain, focal sensory loss, focal weakness, leg pain, myalgias or shortness of breath. Current antihyperlipidemic treatment includes statins and diet change. The current treatment provides moderate improvement of lipids. There are no compliance problems.       Review of Systems  Constitutional: Positive for fatigue. Negative for fever, chills, diaphoresis, appetite change and unexpected weight change.  HENT: Negative.   Eyes: Negative.   Respiratory: Negative.  Negative for cough, choking, chest tightness, shortness of breath, wheezing and stridor.   Cardiovascular: Negative.  Negative for chest pain, palpitations and leg swelling.  Gastrointestinal: Negative.  Negative for nausea, vomiting, abdominal pain, diarrhea and constipation.  Endocrine: Negative.   Genitourinary: Negative.   Musculoskeletal: Positive for arthralgias. Negative for back pain, gait problem, joint swelling, myalgias, neck pain and neck stiffness.  Skin: Negative.   Allergic/Immunologic: Negative.   Neurological: Negative.  Negative for focal weakness.  Hematological: Negative.  Negative for adenopathy. Does not bruise/bleed easily.  Psychiatric/Behavioral: Negative.  Negative for suicidal ideas, hallucinations, behavioral problems, sleep disturbance, self-injury, dysphoric mood and decreased concentration. The patient is not nervous/anxious and is not hyperactive.        Objective:   Physical Exam  Vitals reviewed. Constitutional: He is oriented to  person, place, and time. He appears well-developed and well-nourished. No distress.  HENT:  Head: Normocephalic and atraumatic.  Mouth/Throat: Oropharynx is clear and moist. No oropharyngeal exudate.  Eyes: Conjunctivae are normal. Right eye exhibits no discharge. Left eye exhibits no discharge. No scleral icterus.  Neck: Normal range of motion. Neck supple. No JVD present. No tracheal deviation present. No thyromegaly present.  Cardiovascular: Normal rate, regular rhythm, normal heart sounds and intact distal pulses.  Exam reveals no gallop and no friction rub.   No murmur heard. Pulmonary/Chest: Effort normal and breath sounds normal. No stridor. No respiratory distress. He has no wheezes. He has no rales. He exhibits no tenderness.  Abdominal: Soft. Bowel sounds are normal. He exhibits no distension and no mass. There is no tenderness. There is no rebound and no guarding. Hernia confirmed negative in the right inguinal area and confirmed negative in the left inguinal area.  Genitourinary: Rectum normal, prostate normal, testes normal and penis normal. Rectal exam shows no external hemorrhoid, no internal hemorrhoid, no fissure, no mass, no tenderness and anal tone normal. Guaiac negative stool. Prostate is not tender. Enlarged: 1-2 + BPH rt>>lt lobe. Right testis shows no mass, no swelling and no tenderness. Right testis is descended. Left testis shows no mass, no swelling and no tenderness. Left testis is descended. No phimosis, paraphimosis, hypospadias, penile erythema or penile tenderness. No discharge found.  Musculoskeletal: Normal range of motion. He exhibits no edema and no tenderness.  Lymphadenopathy:    He has no cervical adenopathy.       Right: No inguinal adenopathy present.       Left: No inguinal adenopathy present.  Neurological: He is oriented to person, place, and time.  Skin: Skin is warm and dry. No rash noted. He is not diaphoretic. No erythema.  No pallor.  Psychiatric: He  has a normal mood and affect. His behavior is normal. Judgment and thought content normal.      Lab Results  Component Value Date   WBC 8.7 03/22/2012   HGB 15.7 03/22/2012   HCT 45.8 03/22/2012   PLT 226 03/22/2012   GLUCOSE 98 03/22/2012   CHOL 165 06/16/2011   TRIG 213.0* 06/16/2011   HDL 40.90 06/16/2011   LDLDIRECT 107.6 06/16/2011   LDLCALC 89 02/02/2011   ALT 33 03/22/2012   AST 27 03/22/2012   NA 141 03/22/2012   K 4.4 03/22/2012   CL 105 03/22/2012   CREATININE 1.0 03/22/2012   BUN 15.0 03/22/2012   CO2 28 03/22/2012   TSH 1.47 06/16/2011   PSA 2.65 02/02/2011   HGBA1C 5.5 06/16/2011      Assessment & Plan:

## 2013-05-08 NOTE — Assessment & Plan Note (Signed)
He needs to have an updated DEXA scan and to start a Vit D supplement

## 2013-05-08 NOTE — Assessment & Plan Note (Signed)
I will recheck his CBC and SPEP to see if this has progressed

## 2013-05-08 NOTE — Assessment & Plan Note (Addendum)

## 2013-05-09 ENCOUNTER — Encounter: Payer: Self-pay | Admitting: Internal Medicine

## 2013-05-12 LAB — SPEP & IFE WITH QIG
ALBUMIN ELP: 62.2 % (ref 55.8–66.1)
Alpha-1-Globulin: 3.7 % (ref 2.9–4.9)
Alpha-2-Globulin: 12.1 % — ABNORMAL HIGH (ref 7.1–11.8)
Beta 2: 4.6 % (ref 3.2–6.5)
Beta Globulin: 5.6 % (ref 4.7–7.2)
GAMMA GLOBULIN: 11.8 % (ref 11.1–18.8)
IGA: 158 mg/dL (ref 68–379)
IGM, SERUM: 10 mg/dL — AB (ref 41–251)
IgG (Immunoglobin G), Serum: 1050 mg/dL (ref 650–1600)
M-Spike, %: 0.5 g/dL
Total Protein, Serum Electrophoresis: 7.5 g/dL (ref 6.0–8.3)

## 2013-05-12 NOTE — Addendum Note (Signed)
Addended by: Janith Lima on: 05/12/2013 02:54 PM   Modules accepted: Orders

## 2013-05-15 ENCOUNTER — Telehealth: Payer: Self-pay | Admitting: Internal Medicine

## 2013-05-15 NOTE — Telephone Encounter (Signed)
LVOM FOR PATIENT TO RETURN CALL FOR APPT

## 2013-05-16 ENCOUNTER — Telehealth: Payer: Self-pay | Admitting: Internal Medicine

## 2013-05-16 NOTE — Telephone Encounter (Signed)
CALLED PATIENT TO SCHEDULED CONTACT NUMBERS ARE NOT IN SERVICE.

## 2013-05-21 ENCOUNTER — Other Ambulatory Visit: Payer: Self-pay | Admitting: Dermatology

## 2013-05-21 DIAGNOSIS — D485 Neoplasm of uncertain behavior of skin: Secondary | ICD-10-CM | POA: Diagnosis not present

## 2013-05-21 DIAGNOSIS — L259 Unspecified contact dermatitis, unspecified cause: Secondary | ICD-10-CM | POA: Diagnosis not present

## 2013-05-21 DIAGNOSIS — L988 Other specified disorders of the skin and subcutaneous tissue: Secondary | ICD-10-CM | POA: Diagnosis not present

## 2013-06-25 DIAGNOSIS — L219 Seborrheic dermatitis, unspecified: Secondary | ICD-10-CM | POA: Diagnosis not present

## 2013-06-25 DIAGNOSIS — L719 Rosacea, unspecified: Secondary | ICD-10-CM | POA: Diagnosis not present

## 2013-08-14 DIAGNOSIS — H43819 Vitreous degeneration, unspecified eye: Secondary | ICD-10-CM | POA: Diagnosis not present

## 2013-08-14 DIAGNOSIS — H353190 Nonexudative age-related macular degeneration, unspecified eye, stage unspecified: Secondary | ICD-10-CM | POA: Diagnosis not present

## 2013-08-14 DIAGNOSIS — Z961 Presence of intraocular lens: Secondary | ICD-10-CM | POA: Diagnosis not present

## 2013-10-16 ENCOUNTER — Other Ambulatory Visit: Payer: Self-pay | Admitting: Internal Medicine

## 2013-10-16 ENCOUNTER — Telehealth: Payer: Self-pay | Admitting: Internal Medicine

## 2013-10-16 NOTE — Telephone Encounter (Signed)
Pt requesting to switch PCP from [facility] to [facility].  Pt states he is a former [facility] patient and Dr. Joni Fears apparently had spoken with [facility] about establish with him.  However, pt was scheduled with Dr. Ronnald Ramp instead but prefers to see a [facility] provider.  Please advise if ok to switch PCP.

## 2013-10-16 NOTE — Telephone Encounter (Signed)
yes

## 2013-10-16 NOTE — Telephone Encounter (Signed)
OK with me since Dr Joni Fears had discussed with pt.

## 2013-11-06 ENCOUNTER — Encounter: Payer: Self-pay | Admitting: Family Medicine

## 2013-11-06 ENCOUNTER — Ambulatory Visit (INDEPENDENT_AMBULATORY_CARE_PROVIDER_SITE_OTHER): Payer: Medicare Other | Admitting: Family Medicine

## 2013-11-06 VITALS — BP 128/78 | HR 70 | Temp 98.3°F | Wt 168.0 lb

## 2013-11-06 DIAGNOSIS — R32 Unspecified urinary incontinence: Secondary | ICD-10-CM | POA: Diagnosis not present

## 2013-11-06 DIAGNOSIS — K219 Gastro-esophageal reflux disease without esophagitis: Secondary | ICD-10-CM

## 2013-11-06 DIAGNOSIS — L719 Rosacea, unspecified: Secondary | ICD-10-CM | POA: Insufficient documentation

## 2013-11-06 DIAGNOSIS — Z23 Encounter for immunization: Secondary | ICD-10-CM

## 2013-11-06 MED ORDER — MIRABEGRON ER 25 MG PO TB24: 25.0000 mg | ORAL_TABLET | Freq: Every day | ORAL | Status: DC

## 2013-11-06 NOTE — Addendum Note (Signed)
Addended by: Eulas Post on: 11/06/2013 02:34 PM   Modules accepted: Orders

## 2013-11-06 NOTE — Progress Notes (Signed)
Pre visit review using our clinic review tool, if applicable. No additional management support is needed unless otherwise documented below in the visit note. 

## 2013-11-06 NOTE — Patient Instructions (Signed)
Gastroesophageal Reflux Disease, Adult Gastroesophageal reflux disease (GERD) happens when acid from your stomach flows up into the esophagus. When acid comes in contact with the esophagus, the acid causes soreness (inflammation) in the esophagus. Over time, GERD may create small holes (ulcers) in the lining of the esophagus. CAUSES   Increased body weight. This puts pressure on the stomach, making acid rise from the stomach into the esophagus.  Smoking. This increases acid production in the stomach.  Drinking alcohol. This causes decreased pressure in the lower esophageal sphincter (valve or ring of muscle between the esophagus and stomach), allowing acid from the stomach into the esophagus.  Late evening meals and a full stomach. This increases pressure and acid production in the stomach.  A malformed lower esophageal sphincter. Sometimes, no cause is found. SYMPTOMS   Burning pain in the lower part of the mid-chest behind the breastbone and in the mid-stomach area. This may occur twice a week or more often.  Trouble swallowing.  Sore throat.  Dry cough.  Asthma-like symptoms including chest tightness, shortness of breath, or wheezing. DIAGNOSIS  Your caregiver may be able to diagnose GERD based on your symptoms. In some cases, X-rays and other tests may be done to check for complications or to check the condition of your stomach and esophagus. TREATMENT  Your caregiver may recommend over-the-counter or prescription medicines to help decrease acid production. Ask your caregiver before starting or adding any new medicines.  HOME CARE INSTRUCTIONS   Change the factors that you can control. Ask your caregiver for guidance concerning weight loss, quitting smoking, and alcohol consumption.  Avoid foods and drinks that make your symptoms worse, such as:  Caffeine or alcoholic drinks.  Chocolate.  Peppermint or mint flavorings.  Garlic and onions.  Spicy foods.  Citrus fruits,  such as oranges, lemons, or limes.  Tomato-based foods such as sauce, chili, salsa, and pizza.  Fried and fatty foods.  Avoid lying down for the 3 hours prior to your bedtime or prior to taking a nap.  Eat small, frequent meals instead of large meals.  Wear loose-fitting clothing. Do not wear anything tight around your waist that causes pressure on your stomach.  Raise the head of your bed 6 to 8 inches with wood blocks to help you sleep. Extra pillows will not help.  Only take over-the-counter or prescription medicines for pain, discomfort, or fever as directed by your caregiver.  Do not take aspirin, ibuprofen, or other nonsteroidal anti-inflammatory drugs (NSAIDs). SEEK IMMEDIATE MEDICAL CARE IF:   You have pain in your arms, neck, jaw, teeth, or back.  Your pain increases or changes in intensity or duration.  You develop nausea, vomiting, or sweating (diaphoresis).  You develop shortness of breath, or you faint.  Your vomit is green, yellow, black, or looks like coffee grounds or blood.  Your stool is red, bloody, or black. These symptoms could be signs of other problems, such as heart disease, gastric bleeding, or esophageal bleeding. MAKE SURE YOU:   Understand these instructions.  Will watch your condition.  Will get help right away if you are not doing well or get worse. Document Released: 01/11/2005 Document Revised: 06/26/2011 Document Reviewed: 10/21/2010 [facility] Patient Information 2015 [place], [place]. This information is not intended to replace advice given to you by your health care provider. Make sure you discuss any questions you have with your health care provider.  Try addition of Zantac or Pepcid to your Nexium for symptom relief of GERD

## 2013-11-06 NOTE — Progress Notes (Signed)
   Subjective:    Patient ID: Bryan Norris, male    DOB: 1924-12-28, 80 y.o.   MRN: [id number]  HPI Patient is seen to establish care. Past medical history significant for allergic rhinitis, degenerative arthritis, GERD, urinary urgency, hyperlipidemia, monoclonal gammopathy of unknown significance, rosacea, and history of vitamin D deficiency.  GERD. Still has occasional breakthrough symptoms even with Nexium. No recent dysphagia. No reported appetite or weight changes.  He has urinary urgency. Currently not taking medication. Previously took Enablex. He is not recall if this worked well. Denies any recent obstructive symptoms. History of elevated PSA but would not recommend further evaluation given his age.  He is inclined flu vaccines. He does agree to Pneumovax. He's had previous shingles vaccine.  Past Medical History  Diagnosis Date  . Arthritis   . Blood in stool   . High cholesterol   . FH: colonic polyps   . Diverticulitis   . Urinary incontinence   . Esophageal stricture   . GI bleed   . IBS (irritable bowel syndrome)   . MGUS (monoclonal gammopathy of unknown significance) 01/2004  . Dyslipidemia   . Rosacea 2009   Past Surgical History  Procedure Laterality Date  . Inguinal hernia repair    . Rotator cuff repair    . Diviated septum    . Tonsillectomy      reports that he has quit smoking. He has never used smokeless tobacco. He reports that he does not drink alcohol or use illicit drugs. family history includes GI Bleed in his father; Heart disease in his brother, brother, and mother. Allergies  Allergen Reactions  . Flu Virus Vaccine     rash  . Tetanus Toxoids     Fever and chills      Review of Systems  Constitutional: Negative for appetite change, fatigue and unexpected weight change.  HENT: Negative for trouble swallowing.   Respiratory: Negative for cough and shortness of breath.   Cardiovascular: Negative for chest pain and palpitations.    Gastrointestinal: Negative for nausea, vomiting, abdominal pain and blood in stool.  Genitourinary: Positive for urgency and frequency. Negative for decreased urine volume.       Objective:   Physical Exam  Constitutional: He appears well-developed and well-nourished.  Neck: Neck supple. No thyromegaly present.  Cardiovascular: Normal rate and regular rhythm.  Exam reveals no gallop.   Pulmonary/Chest: Effort normal and breath sounds normal. No respiratory distress. He has no wheezes. He has no rales.  Musculoskeletal: He exhibits no edema.          Assessment & Plan:  #1 GERD. Still has some breakthrough symptoms with Nexium. Try addition of Zantac or Pepcid for symptom relief. Be in touch if this is not relieving symptoms #2 urinary urgency. Trial of Mirbetriq 25 mg once daily #3 health maintenance. Prevnar 13 given #4 hyperlipidemia. Recent lipids are reviewed and at goal. Continue Crestor. Repeat at followup in 6 months

## 2013-11-27 ENCOUNTER — Encounter: Payer: Self-pay | Admitting: Internal Medicine

## 2014-02-09 ENCOUNTER — Ambulatory Visit (INDEPENDENT_AMBULATORY_CARE_PROVIDER_SITE_OTHER): Payer: Medicare Other | Admitting: Family Medicine

## 2014-02-09 ENCOUNTER — Encounter: Payer: Self-pay | Admitting: Family Medicine

## 2014-02-09 VITALS — BP 142/68 | HR 71 | Temp 97.8°F | Wt 171.0 lb

## 2014-02-09 DIAGNOSIS — S20222A Contusion of left back wall of thorax, initial encounter: Secondary | ICD-10-CM | POA: Diagnosis not present

## 2014-02-09 NOTE — Progress Notes (Signed)
Pre visit review using our clinic review tool, if applicable. No additional management support is needed unless otherwise documented below in the visit note. 

## 2014-02-09 NOTE — Progress Notes (Signed)
   Subjective:    Patient ID: Bryan Norris, male    DOB: 1925-03-29, 80 y.o.   MRN: [id number]  HPI 80 year old male presents today after having fallen 4 days ago.  He was getting out of the shower and tripped/ stumbled backwards into the door.  The door handle was pushed into his back and he slid to the floor.  Denies bruising, continued pain, numbness/ tingling, radiation to the legs.  Denies syncope or lightheadedness.  He does not think that he slipped on wet floors or tripping on the rugs on the floor. No pain with deep breathing.  No decrease in ADLs.   Review of Systems  Constitutional: Negative for fever, activity change and fatigue.  Respiratory: Negative for chest tightness and shortness of breath.   Cardiovascular: Negative for chest pain.  Musculoskeletal: Negative for back pain, gait problem, joint swelling and myalgias.       Tenderness to the left lateral side.  No pain.  Neurological: Negative for dizziness, syncope, weakness, light-headedness, numbness and headaches.       Objective:   Physical Exam  Nursing note and vitals reviewed. Constitutional: He is oriented to person, place, and time. He appears well-developed and well-nourished. No distress.  Cardiovascular: Normal rate, regular rhythm and normal heart sounds.   No murmur heard. Pulmonary/Chest: Effort normal and breath sounds normal. No respiratory distress. He has no wheezes.  Musculoskeletal: Normal range of motion. He exhibits tenderness. He exhibits no edema.  Tender over left lateral side in the spot where he hit the door.  No pain with palpation down the spine.  No other muscle pain.  Strength and ROM normal.  Neurological: He is alert and oriented to person, place, and time. He displays normal reflexes. Coordination normal.  No decrease in sensation.  Skin: He is not diaphoretic.  Psychiatric: He has a normal mood and affect. His behavior is normal. Judgment and thought content normal.          Assessment & Plan:  Low back pain due to fall- patient will continue to give the back time.  Slight tenderness to the left lateral back; likely a bruise to the rib.  No point tenderness over the spine.  No x-rays needed.  Patient can use ice/ heat if he feels uncomfortable.     Joseph Art, PA-S  As above. No syncope and steady gait at this time.  Suspect contusion left posterior rib cage.  He is already improved c/w few days ago.  Carolann Littler MD

## 2014-02-09 NOTE — Patient Instructions (Signed)

## 2014-04-14 ENCOUNTER — Other Ambulatory Visit: Payer: Self-pay | Admitting: Internal Medicine

## 2014-04-14 NOTE — Telephone Encounter (Signed)
Refill for 6 months. 

## 2014-07-06 ENCOUNTER — Other Ambulatory Visit: Payer: Self-pay

## 2014-07-06 MED ORDER — ROSUVASTATIN CALCIUM 10 MG PO TABS: ORAL_TABLET | ORAL | Status: DC

## 2014-07-06 NOTE — Telephone Encounter (Signed)
Rx request for Crestor 10 mg tablet-Take 1 tablet by mouth daily #90  Pharm:  [facility]

## 2014-10-10 ENCOUNTER — Other Ambulatory Visit: Payer: Self-pay | Admitting: Family Medicine

## 2014-11-04 ENCOUNTER — Other Ambulatory Visit: Payer: Self-pay | Admitting: Family Medicine

## 2015-01-04 ENCOUNTER — Other Ambulatory Visit: Payer: Self-pay | Admitting: Family Medicine

## 2015-03-10 ENCOUNTER — Other Ambulatory Visit: Payer: Self-pay | Admitting: Family Medicine

## 2015-03-31 ENCOUNTER — Encounter: Payer: Self-pay | Admitting: Family Medicine

## 2015-03-31 ENCOUNTER — Ambulatory Visit (INDEPENDENT_AMBULATORY_CARE_PROVIDER_SITE_OTHER): Payer: Medicare Other | Admitting: Family Medicine

## 2015-03-31 VITALS — BP 110/90 | HR 90 | Temp 97.6°F | Resp 16 | Ht 66.5 in | Wt 163.4 lb

## 2015-03-31 DIAGNOSIS — Z0000 Encounter for general adult medical examination without abnormal findings: Secondary | ICD-10-CM | POA: Diagnosis not present

## 2015-03-31 DIAGNOSIS — Z23 Encounter for immunization: Secondary | ICD-10-CM | POA: Diagnosis not present

## 2015-03-31 DIAGNOSIS — E785 Hyperlipidemia, unspecified: Secondary | ICD-10-CM | POA: Diagnosis not present

## 2015-03-31 DIAGNOSIS — E559 Vitamin D deficiency, unspecified: Secondary | ICD-10-CM | POA: Diagnosis not present

## 2015-03-31 LAB — LIPID PANEL
CHOL/HDL RATIO: 4
Cholesterol: 148 mg/dL (ref 0–200)
HDL: 41.2 mg/dL (ref >39.00)
LDL CALC: 82 mg/dL (ref 0–99)
NONHDL: 106.57
Triglycerides: 121 mg/dL (ref 0.0–149.0)
VLDL: 24.2 mg/dL (ref 0.0–40.0)

## 2015-03-31 LAB — CBC WITH DIFFERENTIAL/PLATELET
BASOS ABS: 0 10*3/uL (ref 0.0–0.1)
Basophils Relative: 0.2 % (ref 0.0–3.0)
EOS ABS: 0.1 10*3/uL (ref 0.0–0.7)
Eosinophils Relative: 1.2 % (ref 0.0–5.0)
HEMATOCRIT: 51.9 % (ref 39.0–52.0)
HEMOGLOBIN: 17.5 g/dL — AB (ref 13.0–17.0)
LYMPHS PCT: 12.8 % (ref 12.0–46.0)
Lymphs Abs: 1.3 10*3/uL (ref 0.7–4.0)
MCHC: 33.8 g/dL (ref 30.0–36.0)
MCV: 91.7 fl (ref 78.0–100.0)
MONO ABS: 0.9 10*3/uL (ref 0.1–1.0)
Monocytes Relative: 8.2 % (ref 3.0–12.0)
Neutro Abs: 8.1 10*3/uL — ABNORMAL HIGH (ref 1.4–7.7)
Neutrophils Relative %: 77.6 % — ABNORMAL HIGH (ref 43.0–77.0)
Platelets: 249 10*3/uL (ref 150.0–400.0)
RBC: 5.66 Mil/uL (ref 4.22–5.81)
RDW: 14.9 % (ref 11.5–15.5)
WBC: 10.5 10*3/uL (ref 4.0–10.5)

## 2015-03-31 LAB — HEPATIC FUNCTION PANEL
ALK PHOS: 71 U/L (ref 39–117)
ALT: 22 U/L (ref 0–53)
AST: 21 U/L (ref 0–37)
Albumin: 4.9 g/dL (ref 3.5–5.2)
BILIRUBIN DIRECT: 0.2 mg/dL (ref 0.0–0.3)
BILIRUBIN TOTAL: 0.8 mg/dL (ref 0.2–1.2)
Total Protein: 8.1 g/dL (ref 6.0–8.3)

## 2015-03-31 LAB — BASIC METABOLIC PANEL
BUN: 18 mg/dL (ref 6–23)
CHLORIDE: 102 meq/L (ref 96–112)
CO2: 31 meq/L (ref 19–32)
CREATININE: 1.13 mg/dL (ref 0.40–1.50)
Calcium: 10.2 mg/dL (ref 8.4–10.5)
GFR: 64.98 mL/min (ref >60.00)
GLUCOSE: 108 mg/dL — AB (ref 70–99)
POTASSIUM: 5.3 meq/L — AB (ref 3.5–5.1)
Sodium: 142 mEq/L (ref 135–145)

## 2015-03-31 LAB — TSH: TSH: 1.21 u[IU]/mL (ref 0.35–4.50)

## 2015-03-31 LAB — VITAMIN D 25 HYDROXY (VIT D DEFICIENCY, FRACTURES): VITD: 22.88 ng/mL — AB (ref 30.00–100.00)

## 2015-03-31 NOTE — Patient Instructions (Signed)
We will call you regarding lab work done today Let us know if you change your mind regarding flu vaccine

## 2015-03-31 NOTE — Progress Notes (Signed)
   Subjective:    Patient ID: Bryan Norris, male    DOB: 1924-06-02, 81 y.o.   MRN: [id number]  HPI   Patient here for complete physical. His chronic problems include past history of vitamin D deficiency, urinary urgency, reported osteoporosis, monoclonal gammopathy of known significance, hyperlipidemia, GERD. No history of CAD or peripheral vascular disease. Takes Crestor regularly. No recent lab work. He takes Myrbetriq, but inconsistently Denies recent falls. Prevnar last year but needs Pneumovax. He declines flu vaccine  Past Medical History  Diagnosis Date  . Arthritis   . Blood in stool   . High cholesterol   . FH: colonic polyps   . Diverticulitis   . Urinary incontinence   . Esophageal stricture   . GI bleed   . IBS (irritable bowel syndrome)   . MGUS (monoclonal gammopathy of unknown significance) 01/2004  . Dyslipidemia   . Rosacea 2009   Past Surgical History  Procedure Laterality Date  . Inguinal hernia repair    . Rotator cuff repair    . Diviated septum    . Tonsillectomy      reports that he has quit smoking. He has never used smokeless tobacco. He reports that he does not drink alcohol or use illicit drugs. family history includes GI Bleed in his father; Heart disease in his brother, brother, and mother. Allergies  Allergen Reactions  . Flu Virus Vaccine     rash  . Tetanus Toxoids     Fever and chills      Review of Systems  Constitutional: Negative for fever, activity change, appetite change and fatigue.  HENT: Negative for congestion, ear pain and trouble swallowing.   Eyes: Negative for pain and visual disturbance.  Respiratory: Negative for cough, shortness of breath and wheezing.   Cardiovascular: Negative for chest pain and palpitations.  Gastrointestinal: Negative for nausea, vomiting, abdominal pain, diarrhea, constipation, blood in stool, abdominal distention and rectal pain.  Genitourinary: Negative for dysuria, hematuria and testicular  pain.  Musculoskeletal: Positive for arthralgias. Negative for joint swelling.  Skin: Negative for rash.  Neurological: Negative for dizziness, syncope and headaches.  Hematological: Negative for adenopathy.  Psychiatric/Behavioral: Negative for confusion and dysphoric mood.       Objective:   Physical Exam  Constitutional: He is oriented to person, place, and time. He appears well-developed and well-nourished.  HENT:  Right Ear: External ear normal.  Left Ear: External ear normal.  Mouth/Throat: Oropharynx is clear and moist.  Neck: Neck supple. No JVD present. No thyromegaly present.  Cardiovascular: Normal rate and regular rhythm.   Pulmonary/Chest: Effort normal and breath sounds normal. No respiratory distress. He has no wheezes. He has no rales.  Abdominal: Soft. Bowel sounds are normal. He exhibits no distension and no mass. There is no tenderness. There is no rebound and no guarding.  Musculoskeletal: He exhibits no edema.  Neurological: He is alert and oriented to person, place, and time. No cranial nerve deficit.  Skin: No rash noted.  Psychiatric: He has a normal mood and affect. His behavior is normal.          Assessment & Plan:  Physical exam. Pneumovax given. Flu vaccine encouraged but he refuses. Obtain screening labs-lipid and hepatic panel with chronic Crestor use. Also check vitamin D level. Discussed prevention of falls

## 2015-04-01 MED ORDER — ERGOCALCIFEROL 1.25 MG (50000 UT) PO CAPS: 50000.0000 [IU] | ORAL_CAPSULE | ORAL | Status: DC

## 2015-04-01 NOTE — Addendum Note (Signed)
Addended by: Elio Forget on: 04/01/2015 11:46 AM   Modules accepted: Orders

## 2015-04-01 NOTE — Addendum Note (Signed)
Addended by: Elio Forget on: 04/01/2015 11:03 AM   Modules accepted: Orders

## 2015-09-16 DIAGNOSIS — H353132 Nonexudative age-related macular degeneration, bilateral, intermediate dry stage: Secondary | ICD-10-CM | POA: Diagnosis not present

## 2015-10-26 DIAGNOSIS — M25561 Pain in right knee: Secondary | ICD-10-CM | POA: Diagnosis not present

## 2015-10-26 DIAGNOSIS — M25562 Pain in left knee: Secondary | ICD-10-CM | POA: Diagnosis not present

## 2015-10-30 DIAGNOSIS — M25561 Pain in right knee: Secondary | ICD-10-CM | POA: Diagnosis not present

## 2015-11-02 DIAGNOSIS — M25561 Pain in right knee: Secondary | ICD-10-CM | POA: Diagnosis not present

## 2015-12-22 DIAGNOSIS — L299 Pruritus, unspecified: Secondary | ICD-10-CM | POA: Diagnosis not present

## 2015-12-22 DIAGNOSIS — L719 Rosacea, unspecified: Secondary | ICD-10-CM | POA: Diagnosis not present

## 2015-12-23 ENCOUNTER — Other Ambulatory Visit: Payer: Self-pay | Admitting: *Deleted

## 2015-12-23 MED ORDER — MIRABEGRON ER 25 MG PO TB24: ORAL_TABLET | ORAL | 5 refills | Status: DC

## 2015-12-30 ENCOUNTER — Telehealth: Payer: Self-pay | Admitting: *Deleted

## 2015-12-30 NOTE — Telephone Encounter (Signed)
Called pt's insurance for PA on Myrbetriq ER 25 mg tablet. Spoke to Bryan Norris and gave her information and answered questions regarding medication. Bryan Norris said it has been pre-approved and will send for review, await fax for final approval. Told her okay thanks.

## 2015-12-31 ENCOUNTER — Telehealth: Payer: Self-pay

## 2015-12-31 NOTE — Telephone Encounter (Signed)
Received fax from insurance with approval for myrbetriq 25mg . Form faxed back to pharmacy with approval.

## 2016-03-07 DIAGNOSIS — H353112 Nonexudative age-related macular degeneration, right eye, intermediate dry stage: Secondary | ICD-10-CM | POA: Diagnosis not present

## 2016-03-07 DIAGNOSIS — H353123 Nonexudative age-related macular degeneration, left eye, advanced atrophic without subfoveal involvement: Secondary | ICD-10-CM | POA: Diagnosis not present

## 2016-03-07 DIAGNOSIS — H43813 Vitreous degeneration, bilateral: Secondary | ICD-10-CM | POA: Diagnosis not present

## 2016-03-07 DIAGNOSIS — H15813 Equatorial staphyloma, bilateral: Secondary | ICD-10-CM | POA: Diagnosis not present

## 2016-03-15 DIAGNOSIS — L719 Rosacea, unspecified: Secondary | ICD-10-CM | POA: Diagnosis not present

## 2016-03-15 DIAGNOSIS — L603 Nail dystrophy: Secondary | ICD-10-CM | POA: Diagnosis not present

## 2016-03-24 ENCOUNTER — Other Ambulatory Visit: Payer: Self-pay | Admitting: Family Medicine

## 2016-05-03 ENCOUNTER — Encounter: Payer: Medicare Other | Admitting: Family Medicine

## 2016-05-10 ENCOUNTER — Ambulatory Visit (INDEPENDENT_AMBULATORY_CARE_PROVIDER_SITE_OTHER): Payer: PPO | Admitting: Family Medicine

## 2016-05-10 ENCOUNTER — Encounter: Payer: Self-pay | Admitting: Family Medicine

## 2016-05-10 VITALS — BP 162/90 | HR 83 | Ht 66.5 in | Wt 163.0 lb

## 2016-05-10 DIAGNOSIS — R030 Elevated blood-pressure reading, without diagnosis of hypertension: Secondary | ICD-10-CM | POA: Diagnosis not present

## 2016-05-10 DIAGNOSIS — Z0000 Encounter for general adult medical examination without abnormal findings: Secondary | ICD-10-CM | POA: Diagnosis not present

## 2016-05-10 LAB — CBC WITH DIFFERENTIAL/PLATELET
BASOS ABS: 0.1 10*3/uL (ref 0.0–0.1)
Basophils Relative: 0.7 % (ref 0.0–3.0)
EOS ABS: 0.3 10*3/uL (ref 0.0–0.7)
Eosinophils Relative: 2.1 % (ref 0.0–5.0)
HEMATOCRIT: 45.9 % (ref 39.0–52.0)
HEMOGLOBIN: 15.8 g/dL (ref 13.0–17.0)
LYMPHS PCT: 7.5 % — AB (ref 12.0–46.0)
Lymphs Abs: 0.9 10*3/uL (ref 0.7–4.0)
MCHC: 34.4 g/dL (ref 30.0–36.0)
MCV: 87.1 fl (ref 78.0–100.0)
MONO ABS: 1 10*3/uL (ref 0.1–1.0)
Monocytes Relative: 8.4 % (ref 3.0–12.0)
NEUTROS ABS: 9.8 10*3/uL — AB (ref 1.4–7.7)
Neutrophils Relative %: 81.3 % — ABNORMAL HIGH (ref 43.0–77.0)
PLATELETS: 242 10*3/uL (ref 150.0–400.0)
RBC: 5.27 Mil/uL (ref 4.22–5.81)
RDW: 14.2 % (ref 11.5–15.5)
WBC: 12 10*3/uL — AB (ref 4.0–10.5)

## 2016-05-10 LAB — HEPATIC FUNCTION PANEL
ALK PHOS: 100 U/L (ref 39–117)
ALT: 13 U/L (ref 0–53)
AST: 14 U/L (ref 0–37)
Albumin: 4.6 g/dL (ref 3.5–5.2)
BILIRUBIN DIRECT: 0.2 mg/dL (ref 0.0–0.3)
BILIRUBIN TOTAL: 0.8 mg/dL (ref 0.2–1.2)
Total Protein: 7.5 g/dL (ref 6.0–8.3)

## 2016-05-10 LAB — BASIC METABOLIC PANEL
BUN: 16 mg/dL (ref 6–23)
CHLORIDE: 102 meq/L (ref 96–112)
CO2: 31 meq/L (ref 19–32)
CREATININE: 1.06 mg/dL (ref 0.40–1.50)
Calcium: 9.8 mg/dL (ref 8.4–10.5)
GFR: 69.78 mL/min (ref >60.00)
Glucose, Bld: 107 mg/dL — ABNORMAL HIGH (ref 70–99)
Potassium: 5.1 mEq/L (ref 3.5–5.1)
Sodium: 141 mEq/L (ref 135–145)

## 2016-05-10 LAB — LIPID PANEL
CHOL/HDL RATIO: 4
Cholesterol: 132 mg/dL (ref 0–200)
HDL: 32 mg/dL — ABNORMAL LOW (ref >39.00)
LDL Cholesterol: 78 mg/dL (ref 0–99)
NonHDL: 99.54
Triglycerides: 108 mg/dL (ref 0.0–149.0)
VLDL: 21.6 mg/dL (ref 0.0–40.0)

## 2016-05-10 LAB — VITAMIN D 25 HYDROXY (VIT D DEFICIENCY, FRACTURES): VITD: 27.41 ng/mL — AB (ref 30.00–100.00)

## 2016-05-10 LAB — TSH: TSH: 1.95 u[IU]/mL (ref 0.35–4.50)

## 2016-05-10 MED ORDER — MIRABEGRON ER 25 MG PO TB24: ORAL_TABLET | ORAL | 2 refills | Status: DC

## 2016-05-10 MED ORDER — ROSUVASTATIN CALCIUM 10 MG PO TABS: ORAL_TABLET | ORAL | 2 refills | Status: DC

## 2016-05-10 NOTE — Progress Notes (Signed)
Subjective:     Patient ID: Bryan Norris, male   DOB: 1924/11/12, 83 y.o.   MRN: [id number]  HPI Patient seen for physical exam. He has history of hyperlipidemia, rosacea, urinary urgency, low vitamin D. Her doing remarkably well. He still flies planes with assistance occasionally. He lives at home and is very independent. He denies any recent falls in the past year. Denies any depression issues. He is currently taking over-the-counter vitamin D. Is on low-dose Crestor.  Denies any recent chest pains. No dyspnea. Declines flu vaccine. Pneumonia vaccines up-to-date.  Past Medical History:  Diagnosis Date  . Arthritis   . Blood in stool   . Diverticulitis   . Dyslipidemia   . Esophageal stricture   . FH: colonic polyps   . GI bleed   . High cholesterol   . IBS (irritable bowel syndrome)   . MGUS (monoclonal gammopathy of unknown significance) 01/2004  . Rosacea 2009  . Urinary incontinence    Past Surgical History:  Procedure Laterality Date  . diviated septum    . INGUINAL HERNIA REPAIR    . ROTATOR CUFF REPAIR    . TONSILLECTOMY      reports that he has quit smoking. He has never used smokeless tobacco. He reports that he does not drink alcohol or use drugs. family history includes GI Bleed in his father; Heart disease in his brother, brother, and mother. Allergies  Allergen Reactions  . Flu Virus Vaccine     rash  . Tetanus Toxoids     Fever and chills     Review of Systems  Constitutional: Negative for chills, fatigue, fever and unexpected weight change.  Eyes: Negative for visual disturbance.  Respiratory: Negative for cough, chest tightness and shortness of breath.   Cardiovascular: Negative for chest pain, palpitations and leg swelling.  Gastrointestinal: Negative for abdominal pain, blood in stool, nausea and vomiting.  Endocrine: Negative for polydipsia and polyuria.  Genitourinary: Negative for dysuria.  Skin: Negative for rash.  Neurological: Negative for  dizziness, syncope, weakness, light-headedness and headaches.  Hematological: Negative for adenopathy. Does not bruise/bleed easily.  Psychiatric/Behavioral: Negative for confusion.       Objective:   Physical Exam  Constitutional: He is oriented to person, place, and time. He appears well-developed and well-nourished.  HENT:  Right Ear: External ear normal.  Left Ear: External ear normal.  Mouth/Throat: Oropharynx is clear and moist. No oropharyngeal exudate.  Eyes: Pupils are equal, round, and reactive to light.  Neck: Neck supple. No thyromegaly present.  Cardiovascular: Normal rate and regular rhythm.   Pulmonary/Chest: Effort normal and breath sounds normal. No respiratory distress. He has no wheezes. He has no rales.  Abdominal: Soft. Bowel sounds are normal. He exhibits no distension and no mass. There is no tenderness. There is no rebound and no guarding.  Small umbilical hernia which is soft and nontender  Musculoskeletal: He exhibits no edema.  Lymphadenopathy:    He has no cervical adenopathy.  Neurological: He is alert and oriented to person, place, and time.  Skin: No rash noted.  Psychiatric: He has a normal mood and affect. His behavior is normal.       Assessment:     Physical exam. Patient has elevated blood pressure reading which is confirmed left and right arm seated after rest 162/90. No prior history of hypertension    Plan:     -He declines flu vaccine -Obtain screening labs include 25 hydroxy vitamin D level -Keep sodium  intake less than 3 g daily -Office follow-up in 2 weeks to reassess blood pressure. If still up at that point consider medication  Eulas Post MD [facility]

## 2016-05-24 ENCOUNTER — Ambulatory Visit (INDEPENDENT_AMBULATORY_CARE_PROVIDER_SITE_OTHER): Payer: PPO | Admitting: Family Medicine

## 2016-05-24 ENCOUNTER — Encounter: Payer: Self-pay | Admitting: Family Medicine

## 2016-05-24 DIAGNOSIS — I10 Essential (primary) hypertension: Secondary | ICD-10-CM

## 2016-05-24 MED ORDER — AMLODIPINE BESYLATE 5 MG PO TABS: 5.0000 mg | ORAL_TABLET | Freq: Every day | ORAL | 11 refills | Status: DC

## 2016-05-24 NOTE — Progress Notes (Signed)
Pre visit review using our clinic review tool, if applicable. No additional management support is needed unless otherwise documented below in the visit note. 

## 2016-05-24 NOTE — Progress Notes (Signed)
Subjective:     Patient ID: Bryan Norris, male   DOB: 1924-08-23, 83 y.o.   MRN: [id number]  HPI Patient for follow-up hypertension. Recent physical with confirmed blood pressure 162/90 after rest. He has not had any headaches or dizziness or chest pain. No peripheral edema. Never been treated for hypertension. He eats out generally once per day and may have some excessive sodium there. No regular alcohol use.  Past Medical History:  Diagnosis Date  . Arthritis   . Blood in stool   . Diverticulitis   . Dyslipidemia   . Esophageal stricture   . FH: colonic polyps   . GI bleed   . High cholesterol   . IBS (irritable bowel syndrome)   . MGUS (monoclonal gammopathy of unknown significance) 01/2004  . Rosacea 2009  . Urinary incontinence    Past Surgical History:  Procedure Laterality Date  . diviated septum    . INGUINAL HERNIA REPAIR    . ROTATOR CUFF REPAIR    . TONSILLECTOMY      reports that he has quit smoking. He has never used smokeless tobacco. He reports that he does not drink alcohol or use drugs. family history includes GI Bleed in his father; Heart disease in his brother, brother, and mother. Allergies  Allergen Reactions  . Flu Virus Vaccine     rash  . Tetanus Toxoids     Fever and chills     Review of Systems  Constitutional: Negative for fatigue.  Eyes: Negative for visual disturbance.  Respiratory: Negative for cough, chest tightness and shortness of breath.   Cardiovascular: Negative for chest pain, palpitations and leg swelling.  Neurological: Negative for dizziness, syncope, weakness, light-headedness and headaches.       Objective:   Physical Exam  Constitutional: He is oriented to person, place, and time. He appears well-developed and well-nourished.  HENT:  Right Ear: External ear normal.  Left Ear: External ear normal.  Mouth/Throat: Oropharynx is clear and moist.  Eyes: Pupils are equal, round, and reactive to light.  Neck: Neck supple. No  thyromegaly present.  Cardiovascular: Normal rate and regular rhythm.   Pulmonary/Chest: Effort normal and breath sounds normal. No respiratory distress. He has no wheezes. He has no rales.  Musculoskeletal: He exhibits no edema.  Neurological: He is alert and oriented to person, place, and time.       Assessment:     Hypertension currently untreated. Repeat left arm seated after rest 170/90    Plan:     -Start amlodipine 5 mg once daily -Keep sodium intake low -Reassess blood pressure one month  Eulas Post MD [facility]

## 2016-06-21 ENCOUNTER — Encounter: Payer: Self-pay | Admitting: Family Medicine

## 2016-06-21 ENCOUNTER — Ambulatory Visit (INDEPENDENT_AMBULATORY_CARE_PROVIDER_SITE_OTHER): Payer: PPO | Admitting: Family Medicine

## 2016-06-21 VITALS — BP 160/90 | HR 64 | Temp 97.8°F | Wt 163.5 lb

## 2016-06-21 DIAGNOSIS — I10 Essential (primary) hypertension: Secondary | ICD-10-CM

## 2016-06-21 MED ORDER — AMLODIPINE BESY-BENAZEPRIL HCL 5-10 MG PO CAPS: 1.0000 | ORAL_CAPSULE | Freq: Every day | ORAL | 11 refills | Status: DC

## 2016-06-21 NOTE — Progress Notes (Signed)
Pre visit review using our clinic review tool, if applicable. No additional management support is needed unless otherwise documented below in the visit note. 

## 2016-06-21 NOTE — Patient Instructions (Signed)
Stop the Amlodipine and start Lotrel 5/10 mg one daily Follow up in 3 weeks to recheck.

## 2016-06-21 NOTE — Progress Notes (Signed)
Subjective:     Patient ID: Bryan Norris, male   DOB: 02/24/1925, 83 y.o.   MRN: [id number]  HPI Patient for follow-up hypertension. Never treated previously. He had consistent blood pressures around 829-937 systolic and 169 diastolic. We started amlodipine 5 mg daily. He's not had any side effects in terms of headache or peripheral edema. He is not monitoring blood pressures at home. No dizziness. No consistent alcohol use. No recent nonsteroidal use.  Past Medical History:  Diagnosis Date  . Arthritis   . Blood in stool   . Diverticulitis   . Dyslipidemia   . Esophageal stricture   . FH: colonic polyps   . GI bleed   . High cholesterol   . IBS (irritable bowel syndrome)   . MGUS (monoclonal gammopathy of unknown significance) 01/2004  . Rosacea 2009  . Urinary incontinence    Past Surgical History:  Procedure Laterality Date  . diviated septum    . INGUINAL HERNIA REPAIR    . ROTATOR CUFF REPAIR    . TONSILLECTOMY      reports that he has quit smoking. He has never used smokeless tobacco. He reports that he does not drink alcohol or use drugs. family history includes GI Bleed in his father; Heart disease in his brother, brother, and mother. Allergies  Allergen Reactions  . Flu Virus Vaccine     rash  . Tetanus Toxoids     Fever and chills     Review of Systems  Constitutional: Negative for fatigue.  Eyes: Negative for visual disturbance.  Respiratory: Negative for cough, chest tightness and shortness of breath.   Cardiovascular: Negative for chest pain, palpitations and leg swelling.  Neurological: Negative for dizziness, syncope, weakness, light-headedness and headaches.       Objective:   Physical Exam  Constitutional: He appears well-developed and well-nourished.  Cardiovascular: Normal rate and regular rhythm.   Pulmonary/Chest: Effort normal and breath sounds normal. No respiratory distress. He has no wheezes. He has no rales.  Musculoskeletal: He exhibits  no edema.       Assessment:     Hypertension. Systolic is slightly improved but not to goal    Plan:     -Discontinue amlodipine and start Lotrel 5/10 mg 1 daily -Reassess blood pressure and 3 weeks and check basic metabolic panel then  Eulas Post MD [facility]

## 2016-07-10 ENCOUNTER — Ambulatory Visit (INDEPENDENT_AMBULATORY_CARE_PROVIDER_SITE_OTHER): Payer: PPO | Admitting: Family Medicine

## 2016-07-10 ENCOUNTER — Encounter: Payer: Self-pay | Admitting: Family Medicine

## 2016-07-10 VITALS — BP 138/78 | HR 61 | Temp 98.2°F | Wt 169.5 lb

## 2016-07-10 DIAGNOSIS — I10 Essential (primary) hypertension: Secondary | ICD-10-CM | POA: Diagnosis not present

## 2016-07-10 LAB — BASIC METABOLIC PANEL
BUN: 20 mg/dL (ref 6–23)
CALCIUM: 9.3 mg/dL (ref 8.4–10.5)
CO2: 30 meq/L (ref 19–32)
CREATININE: 1.13 mg/dL (ref 0.40–1.50)
Chloride: 106 mEq/L (ref 96–112)
GFR: 64.79 mL/min (ref >60.00)
GLUCOSE: 93 mg/dL (ref 70–99)
Potassium: 5 mEq/L (ref 3.5–5.1)
Sodium: 139 mEq/L (ref 135–145)

## 2016-07-10 NOTE — Progress Notes (Signed)
Pre visit review using our clinic review tool, if applicable. No additional management support is needed unless otherwise documented below in the visit note. 

## 2016-07-10 NOTE — Progress Notes (Signed)
Subjective:     Patient ID: Bryan Norris, male   DOB: Jun 12, 1924, 83 y.o.   MRN: [id number]  HPI Patient seen for follow-up hypertension. We had him on amlodipine and BP still poorly controlled and we switched to Lotrel last visit. Blood pressure is much better today. No dizziness. No cough or other side effect. Compliant with therapy. No chest pain.  Past Medical History:  Diagnosis Date  . Arthritis   . Blood in stool   . Diverticulitis   . Dyslipidemia   . Esophageal stricture   . FH: colonic polyps   . GI bleed   . High cholesterol   . IBS (irritable bowel syndrome)   . MGUS (monoclonal gammopathy of unknown significance) 01/2004  . Rosacea 2009  . Urinary incontinence    Past Surgical History:  Procedure Laterality Date  . diviated septum    . INGUINAL HERNIA REPAIR    . ROTATOR CUFF REPAIR    . TONSILLECTOMY      reports that he has quit smoking. He has never used smokeless tobacco. He reports that he does not drink alcohol or use drugs. family history includes GI Bleed in his father; Heart disease in his brother, brother, and mother. Allergies  Allergen Reactions  . Flu Virus Vaccine     rash  . Tetanus Toxoids     Fever and chills    Review of Systems  Constitutional: Negative for fatigue.  Eyes: Negative for visual disturbance.  Respiratory: Negative for cough, chest tightness and shortness of breath.   Cardiovascular: Negative for chest pain, palpitations and leg swelling.  Neurological: Negative for dizziness, syncope, weakness, light-headedness and headaches.       Objective:   Physical Exam  Constitutional: He is oriented to person, place, and time. He appears well-developed and well-nourished.  HENT:  Right Ear: External ear normal.  Left Ear: External ear normal.  Mouth/Throat: Oropharynx is clear and moist.  Eyes: Pupils are equal, round, and reactive to light.  Neck: Neck supple. No thyromegaly present.  Cardiovascular: Normal rate and regular  rhythm.   Pulmonary/Chest: Effort normal and breath sounds normal. No respiratory distress. He has no wheezes. He has no rales.  Musculoskeletal: He exhibits no edema.  Neurological: He is alert and oriented to person, place, and time.       Assessment:     Hypertension-improved with recent change of medication as above to Lotrel.      Plan:     -check basic metabolic panel (with recent initiation of ACE inhibitor) -Routine follow-up in 6 months and sooner as needed  Eulas Post MD [facility] at [facility]

## 2016-08-15 ENCOUNTER — Telehealth: Payer: Self-pay | Admitting: Family Medicine

## 2016-08-15 NOTE — Telephone Encounter (Signed)
Dr. Cristie Hem [facility] is calling stating that the pt needing his tooth extracted and wants to know if there are any meds that would prevent the surgery.

## 2016-08-15 NOTE — Telephone Encounter (Signed)
Reviewed the medication list with the nurse.  ASA only medication.  Okay to proceed.

## 2016-08-20 DIAGNOSIS — J22 Unspecified acute lower respiratory infection: Secondary | ICD-10-CM | POA: Diagnosis not present

## 2016-08-20 DIAGNOSIS — J3089 Other allergic rhinitis: Secondary | ICD-10-CM | POA: Diagnosis not present

## 2016-09-03 DIAGNOSIS — J22 Unspecified acute lower respiratory infection: Secondary | ICD-10-CM | POA: Diagnosis not present

## 2016-09-03 DIAGNOSIS — I10 Essential (primary) hypertension: Secondary | ICD-10-CM | POA: Diagnosis not present

## 2016-09-03 DIAGNOSIS — J3089 Other allergic rhinitis: Secondary | ICD-10-CM | POA: Diagnosis not present

## 2016-10-03 NOTE — Progress Notes (Addendum)
Subjective:   Bryan Norris is a 83 y.o. male who presents for Medicare Annual/Subsequent preventive examination.  He was seen at [facility] twice in May for bronchitis, which he developed after mowing his grass. He is overall feeling better, but still has a cough which he has had for several years.  Review of Systems:  No ROS.  Medicare Wellness Visit. Additional risk factors are reflected in the social history.  Cardiac Risk Factors include: advanced age (>82men, >58 women);dyslipidemia;hypertension;male gender;sedentary lifestyle  Sleep patterns: no sleep issues, gets up 1 times nightly to void and sleeps 10 hours nightly.   Home Safety/Smoke Alarms: Feels safe in home. Smoke alarms in place.  Living environment; residence and Firearm Safety: Lives w/ his 20 y/o son. 2-story house, no firearms. Seat Belt Safety/Bike Helmet: Wears seat belt.   Counseling:   Eye Exam- Follows w/ Dr. Sherlynn Stalls  Dental- Follows w/ dentist regularly  Male:   [place]- Aged out.   PSA-  Lab Results  Component Value Date   PSA 8.31 (H) 05/08/2013   PSA 2.65 02/02/2011   PSA 6.92 (H) 11/04/2009       Objective:    Vitals: BP 140/62   Pulse 74   Ht 5\' 7"  (1.702 m)   Wt 162 lb 9.6 oz (73.8 kg)   SpO2 98%   BMI 25.47 kg/m   Body mass index is 25.47 kg/m.  BP Readings from Last 3 Encounters:  10/04/16 140/62  07/10/16 138/78  06/21/16 (!) 160/90   Tobacco History  Smoking Status  . Former Smoker  Smokeless Tobacco  . Never Used     Counseling given: Not Answered   Past Medical History:  Diagnosis Date  . Arthritis   . Blood in stool   . Diverticulitis   . Dyslipidemia   . Esophageal stricture   . FH: colonic polyps   . GI bleed   . High cholesterol   . IBS (irritable bowel syndrome)   . Macular degeneration   . MGUS (monoclonal gammopathy of unknown significance) 01/2004  . Rosacea 2009  . Urinary incontinence    Past Surgical History:    Procedure Laterality Date  . diviated septum    . INGUINAL HERNIA REPAIR    . ROTATOR CUFF REPAIR    . TONSILLECTOMY     Family History  Problem Relation Age of Onset  . Heart disease Mother   . GI Bleed Father   . Heart disease Brother   . Heart disease Brother    History  Sexual Activity  . Sexual activity: Not Currently    Outpatient Encounter Prescriptions as of 10/04/2016  Medication Sig  . amLODipine-benazepril (LOTREL) 5-10 MG capsule Take 1 capsule by mouth daily.  Marland Kitchen aspirin 81 MG tablet Take 81 mg by mouth daily.    . Cholecalciferol (VITAMIN D) 2000 units CAPS Take 2,000 Units by mouth daily.  . fish oil-omega-3 fatty acids 1000 MG capsule Take 1 g by mouth daily.    . fluticasone (FLONASE) 50 MCG/ACT nasal spray Place 2 sprays into the nose daily.  Marland Kitchen glucosamine-chondroitin 500-400 MG tablet Take 1 tablet by mouth daily.  . mirabegron ER (MYRBETRIQ) 25 MG TB24 tablet TAKE 1 TABLET (25 MG TOTAL) BY MOUTH DAILY.  . Multiple Vitamins-Minerals (ICAPS) CAPS Take 1 capsule by mouth 2 (two) times daily.  Vladimir Faster Glycol-Propyl Glycol (SYSTANE) 0.4-0.3 % SOLN Apply 1 drop to eye as needed.  . rosuvastatin (CRESTOR) 10  MG tablet TAKE 1 TABLET (10 MG TOTAL) BY MOUTH DAILY.   No facility-administered encounter medications on file as of 10/04/2016.     Activities of Daily Living In your present state of health, do you have any difficulty performing the following activities: 10/04/2016  Hearing? N  Vision? N  Difficulty concentrating or making decisions? N  Walking or climbing stairs? N  Dressing or bathing? N  Doing errands, shopping? N  Preparing Food and eating ? N  Using the Toilet? N  In the past six months, have you accidently leaked urine? Y  Managing your Medications? N  Managing your Finances? N  Housekeeping or managing your Housekeeping? N  Some recent data might be hidden    Patient Care Team: Eulas Post, MD as PCP - General (Family  Medicine) Lavonna Monarch, MD as Consulting Physician (Dermatology) Sherlynn Stalls, MD as Consulting Physician (Ophthalmology)   Assessment:    Physical assessment deferred to PCP.  Exercise Activities and Dietary recommendations Current Exercise Habits: The patient does not participate in regular exercise at present  Diet (meal preparation, eat out, water intake, caffeinated beverages, dairy products, fruits and vegetables): in general, a "healthy" diet  , well balanced, on average, 3 meals per day. Drinks water throughout the day.  Breakfast: instant oatmeal and coffee Lunch: goes out to the cafeteria-vegetables, 7 layer salad, sometimes a slice of pie Dinner: meals vary-son prepares most meals  Goals    . Maintain current health status (pt-stated)          Travel more if possible.      Fall Risk Fall Risk  10/04/2016 05/10/2016 03/31/2015 03/31/2015 11/06/2013  Falls in the past year? Yes No No No No  Number falls in past yr: 1 - - - -  Injury with Fall? No - - - -  Risk for fall due to : Impaired balance/gait;Impaired mobility - - - -   Depression Screen PHQ 2/9 Scores 10/04/2016 05/10/2016 03/31/2015 03/31/2015  PHQ - 2 Score 0 0 0 0    Cognitive Function MMSE - Mini Mental State Exam 10/04/2016  Orientation to time 4  Orientation to Place 5  Registration 3  Attention/ Calculation 5  Recall 2  Language- name 2 objects 2  Language- repeat 1  Language- follow 3 step command 3  Language- read & follow direction 1  Write a sentence 1  Copy design 1  Total score 28        Immunization History  Administered Date(s) Administered  . DTaP 06/16/2011  . Influenza Whole 03/07/2011  . Pneumococcal Conjugate-13 11/06/2013  . Pneumococcal Polysaccharide-23 03/31/2015  . Zoster 05/08/2013   Screening Tests Health Maintenance  Topic Date Due  . PNA vac Low Risk Adult  Completed      Plan:    Follow-up w/ PCP as scheduled.  Bring a copy of your advance directives to  your next office visit.  Consider the new shingles vaccine, Shingrix. This is available at most pharmacies.  I have personally reviewed and noted the following in the patient's chart:   . Medical and social history . Use of alcohol, tobacco or illicit drugs  . Current medications and supplements . Functional ability and status . Nutritional status . Physical activity . Advanced directives . List of other physicians . Vitals . Screenings to include cognitive, depression, and falls . Referrals and appointments  In addition, I have reviewed and discussed with patient certain preventive protocols, quality metrics, and best practice recommendations.  A written personalized care plan for preventive services as well as general preventive health recommendations were provided to patient.     Dorrene German, RN  10/04/2016  Agree with assessment as above.  Eulas Post MD [facility] at [facility]

## 2016-10-04 ENCOUNTER — Ambulatory Visit (INDEPENDENT_AMBULATORY_CARE_PROVIDER_SITE_OTHER): Payer: PPO

## 2016-10-04 VITALS — BP 140/62 | HR 74 | Ht 67.0 in | Wt 162.6 lb

## 2016-10-04 DIAGNOSIS — Z0000 Encounter for general adult medical examination without abnormal findings: Secondary | ICD-10-CM | POA: Diagnosis not present

## 2016-10-04 NOTE — Patient Instructions (Addendum)
Mr. Bryan Norris , Thank you for taking time to come for your Medicare Wellness Visit. I appreciate your ongoing commitment to your health goals. Please review the following plan we discussed and let me know if I can assist you in the future.   Bring a copy of your advance directives to your next office visit.  Consider the new shingles vaccine, Shingrix. This is available at most pharmacies.  These are the goals we discussed: Goals    . Maintain current health status (pt-stated)          Travel more if possible.       This is a list of the screening recommended for you and due dates:  Health Maintenance  Topic Date Due  . Pneumonia vaccines  Completed   Preventive Care 81 Years and Older, Male Preventive care refers to lifestyle choices and visits with your health care provider that can promote health and wellness. What does preventive care include?  A yearly physical exam. This is also called an annual well check.  Dental exams once or twice a year.  Routine eye exams. Ask your health care provider how often you should have your eyes checked.  Personal lifestyle choices, including: ? Daily care of your teeth and gums. ? Regular physical activity. ? Eating a healthy diet. ? Avoiding tobacco and drug use. ? Limiting alcohol use. ? Practicing safe sex. ? Taking low doses of aspirin every day. ? Taking vitamin and mineral supplements as recommended by your health care provider. What happens during an annual well check? The services and screenings done by your health care provider during your annual well check will depend on your age, overall health, lifestyle risk factors, and family history of disease. Counseling Your health care provider may ask you questions about your:  Alcohol use.  Tobacco use.  Drug use.  Emotional well-being.  Home and relationship well-being.  Sexual activity.  Eating habits.  History of falls.  Memory and ability to understand  (cognition).  Work and work Statistician.  Screening You may have the following tests or measurements:  Height, weight, and BMI.  Blood pressure.  Lipid and cholesterol levels. These may be checked every 5 years, or more frequently if you are over 81 years old.  Skin check.  Lung cancer screening. You may have this screening every year starting at age 22 if you have a 30-pack-year history of smoking and currently smoke or have quit within the past 15 years.  Fecal occult blood test (FOBT) of the stool. You may have this test every year starting at age 25.  Flexible sigmoidoscopy or colonoscopy. You may have a sigmoidoscopy every 5 years or a colonoscopy every 10 years starting at age 81.  Prostate cancer screening. Recommendations will vary depending on your family history and other risks.  Hepatitis C blood test.  Hepatitis B blood test.  Sexually transmitted disease (STD) testing.  Diabetes screening. This is done by checking your blood sugar (glucose) after you have not eaten for a while (fasting). You may have this done every 1-3 years.  Abdominal aortic aneurysm (AAA) screening. You may need this if you are a current or former smoker.  Osteoporosis. You may be screened starting at age 40 if you are at high risk.  Talk with your health care provider about your test results, treatment options, and if necessary, the need for more tests. Vaccines Your health care provider may recommend certain vaccines, such as:  Influenza vaccine. This is recommended  every year.  Tetanus, diphtheria, and acellular pertussis (Tdap, Td) vaccine. You may need a Td booster every 10 years.  Varicella vaccine. You may need this if you have not been vaccinated.  Zoster vaccine. You may need this after age 50.  Measles, mumps, and rubella (MMR) vaccine. You may need at least one dose of MMR if you were born in 1957 or later. You may also need a second dose.  Pneumococcal 13-valent conjugate  (PCV13) vaccine. One dose is recommended after age 30.  Pneumococcal polysaccharide (PPSV23) vaccine. One dose is recommended after age 6.  Meningococcal vaccine. You may need this if you have certain conditions.  Hepatitis A vaccine. You may need this if you have certain conditions or if you travel or work in places where you may be exposed to hepatitis A.  Hepatitis B vaccine. You may need this if you have certain conditions or if you travel or work in places where you may be exposed to hepatitis B.  Haemophilus influenzae type b (Hib) vaccine. You may need this if you have certain risk factors.  Talk to your health care provider about which screenings and vaccines you need and how often you need them. This information is not intended to replace advice given to you by your health care provider. Make sure you discuss any questions you have with your health care provider. Document Released: 04/30/2015 Document Revised: 12/22/2015 Document Reviewed: 02/02/2015 Elsevier Interactive Patient Education  2017 [employer].

## 2016-10-11 DIAGNOSIS — H15833 Staphyloma posticum, bilateral: Secondary | ICD-10-CM | POA: Diagnosis not present

## 2016-10-11 DIAGNOSIS — H35421 Microcystoid degeneration of retina, right eye: Secondary | ICD-10-CM | POA: Diagnosis not present

## 2016-10-11 DIAGNOSIS — H353122 Nonexudative age-related macular degeneration, left eye, intermediate dry stage: Secondary | ICD-10-CM | POA: Diagnosis not present

## 2016-10-11 DIAGNOSIS — H353113 Nonexudative age-related macular degeneration, right eye, advanced atrophic without subfoveal involvement: Secondary | ICD-10-CM | POA: Diagnosis not present

## 2016-12-29 ENCOUNTER — Encounter: Payer: Self-pay | Admitting: Family Medicine

## 2016-12-29 ENCOUNTER — Ambulatory Visit (INDEPENDENT_AMBULATORY_CARE_PROVIDER_SITE_OTHER): Payer: PPO | Admitting: Family Medicine

## 2016-12-29 VITALS — BP 130/70 | HR 87 | Temp 97.5°F | Wt 162.1 lb

## 2016-12-29 DIAGNOSIS — I10 Essential (primary) hypertension: Secondary | ICD-10-CM | POA: Diagnosis not present

## 2016-12-29 DIAGNOSIS — M5416 Radiculopathy, lumbar region: Secondary | ICD-10-CM

## 2016-12-29 NOTE — Progress Notes (Signed)
Subjective:     Patient ID: Bryan Norris, male   DOB: 31-Oct-1924, 83 y.o.   MRN: [id number]  HPI Patient seen with new problem which is dull achy pain which extends from his anterior thigh somewhat lateral up near the hip area all the way down toward the knee for the past 3 weeks. He denies any recent injury except for one fall yesterday. He is not aware of any definite weakness. He had no numbness. He says his pain is 5 out of 10 at its worst severity. No low back pain.  Denies any fevers or chills. No weight loss or appetite change. No alleviating factors. Pain is worse with movement. He denies any prior history of significant back difficulties. He has history of monoclonal gammopathy of unknown significance. Other chronic problems as outlined below.  Hypertension treated with Lotrel and hyperlipidemia on Crestor. Compliant with medications. Denies any medication side effects.    Past Medical History:  Diagnosis Date  . Arthritis   . Blood in stool   . Diverticulitis   . Dyslipidemia   . Esophageal stricture   . FH: colonic polyps   . GI bleed   . High cholesterol   . IBS (irritable bowel syndrome)   . Macular degeneration   . MGUS (monoclonal gammopathy of unknown significance) 01/2004  . Rosacea 2009  . Urinary incontinence    Past Surgical History:  Procedure Laterality Date  . diviated septum    . INGUINAL HERNIA REPAIR    . ROTATOR CUFF REPAIR    . TONSILLECTOMY      reports that he has quit smoking. He has never used smokeless tobacco. He reports that he does not drink alcohol or use drugs. family history includes GI Bleed in his father; Heart disease in his brother, brother, and mother. Allergies  Allergen Reactions  . Flu Virus Vaccine     rash  . Tetanus Toxoids     Fever and chills     Review of Systems  Constitutional: Negative for activity change, appetite change and fever.  Respiratory: Negative for cough and shortness of breath.   Cardiovascular:  Negative for chest pain and leg swelling.  Gastrointestinal: Negative for abdominal pain and vomiting.  Genitourinary: Negative for dysuria, flank pain, hematuria and urgency.  Musculoskeletal: Negative for back pain and joint swelling.  Neurological: Negative for weakness and numbness.       Objective:   Physical Exam  Constitutional: He appears well-developed and well-nourished.  Cardiovascular: Normal rate and regular rhythm.   Feet are warm to touch with excellent capillary refill. He has 2+ posterior tibial and 1+ dorsalis pedis pulse on the right.  Pulmonary/Chest: Effort normal and breath sounds normal. No respiratory distress. He has no wheezes. He has no rales.  Musculoskeletal: He exhibits no edema.  Straight leg raise are negative bilaterally. He has excellent range of motion right hip without difficulty. No lateral hip tenderness. No bony tenderness.  Neurological:  Patient has full strength with plantarflexion, dorsiflexion, knee extension, and hip flexion bilaterally. He has 2+ ankle reflexes bilaterally. 2+ knee reflexes on left and only trace on the right       Assessment:     #1 Right lower extremity pain. Suspect lumbar radiculitis. He does have finding of decreased knee reflex on the right but no definite weakness. Pain is relatively mild at this time  #2 hypertension stable and at goal    Plan:     -We discussed options including prednisone  taper but at this point he wishes to avoid medications- he has concerns for possible side effects. He is coping fairly well with pain -Follow-up immediately for any weakness or any urine or stool incontinence -Otherwise, we'll plan follow-up in 2 weeks to reassess. May need MRI lumbar spine to further assess  Eulas Post MD [facility] at [facility]

## 2016-12-29 NOTE — Patient Instructions (Signed)
Sciatica Sciatica is pain, numbness, weakness, or tingling along the path of the sciatic nerve. The sciatic nerve starts in the lower back and runs down the back of each leg. The nerve controls the muscles in the lower leg and in the back of the knee. It also provides feeling (sensation) to the back of the thigh, the lower leg, and the sole of the foot. Sciatica is a symptom of another medical condition that pinches or puts pressure on the sciatic nerve. Generally, sciatica only affects one side of the body. Sciatica usually goes away on its own or with treatment. In some cases, sciatica may keep coming back (recur). What are the causes? This condition is caused by pressure on the sciatic nerve, or pinching of the sciatic nerve. This may be the result of:  A disk in between the bones of the spine (vertebrae) bulging out too far (herniated disk).  Age-related changes in the spinal disks (degenerative disk disease).  A pain disorder that affects a muscle in the buttock (piriformis syndrome).  Extra bone growth (bone spur) near the sciatic nerve.  An injury or break (fracture) of the pelvis.  Pregnancy.  Tumor (rare). What increases the risk? The following factors may make you more likely to develop this condition:  Playing sports that place pressure or stress on the spine, such as football or weight lifting.  Having poor strength and flexibility.  A history of back injury.  A history of back surgery.  Sitting for long periods of time.  Doing activities that involve repetitive bending or lifting.  Obesity. What are the signs or symptoms? Symptoms can vary from mild to very severe, and they may include:  Any of these problems in the lower back, leg, hip, or buttock:  Mild tingling or dull aches.  Burning sensations.  Sharp pains.  Numbness in the back of the calf or the sole of the foot.  Leg weakness.  Severe back pain that makes movement difficult. These symptoms may  get worse when you cough, sneeze, or laugh, or when you sit or stand for long periods of time. Being overweight may also make symptoms worse. In some cases, symptoms may recur over time. How is this diagnosed? This condition may be diagnosed based on:  Your symptoms.  A physical exam. Your health care provider may ask you to do certain movements to check whether those movements trigger your symptoms.  You may have tests, including:  Blood tests.  X-rays.  MRI.  CT scan. How is this treated? In many cases, this condition improves on its own, without any treatment. However, treatment may include:  Reducing or modifying physical activity during periods of pain.  Exercising and stretching to strengthen your abdomen and improve the flexibility of your spine.  Icing and applying heat to the affected area.  Medicines that help:  To relieve pain and swelling.  To relax your muscles.  Injections of medicines that help to relieve pain, irritation, and inflammation around the sciatic nerve (steroids).  Surgery. Follow these instructions at home: Medicines   Take over-the-counter and prescription medicines only as told by your health care provider.  Do not drive or operate heavy machinery while taking prescription pain medicine. Managing pain   If directed, apply ice to the affected area.  Put ice in a plastic bag.  Place a towel between your skin and the bag.  Leave the ice on for 20 minutes, 2-3 times a day.  After icing, apply heat to the   affected area before you exercise or as often as told by your health care provider. Use the heat source that your health care provider recommends, such as a moist heat pack or a heating pad.  Place a towel between your skin and the heat source.  Leave the heat on for 20-30 minutes.  Remove the heat if your skin turns bright red. This is especially important if you are unable to feel pain, heat, or cold. You may have a greater risk of  getting burned. Activity   Return to your normal activities as told by your health care provider. Ask your health care provider what activities are safe for you.  Avoid activities that make your symptoms worse.  Take brief periods of rest throughout the day. Resting in a lying or standing position is usually better than sitting to rest.  When you rest for longer periods, mix in some mild activity or stretching between periods of rest. This will help to prevent stiffness and pain.  Avoid sitting for long periods of time without moving. Get up and move around at least one time each hour.  Exercise and stretch regularly, as told by your health care provider.  Do not lift anything that is heavier than 10 lb (4.5 kg) while you have symptoms of sciatica. When you do not have symptoms, you should still avoid heavy lifting, especially repetitive heavy lifting.  When you lift objects, always use proper lifting technique, which includes:  Bending your knees.  Keeping the load close to your body.  Avoiding twisting. General instructions   Use good posture.  Avoid leaning forward while sitting.  Avoid hunching over while standing.  Maintain a healthy weight. Excess weight puts extra stress on your back and makes it difficult to maintain good posture.  Wear supportive, comfortable shoes. Avoid wearing high heels.  Avoid sleeping on a mattress that is too soft or too hard. A mattress that is firm enough to support your back when you sleep may help to reduce your pain.  Keep all follow-up visits as told by your health care provider. This is important. Contact a health care provider if:  You have pain that wakes you up when you are sleeping.  You have pain that gets worse when you lie down.  Your pain is worse than you have experienced in the past.  Your pain lasts longer than 4 weeks.  You experience unexplained weight loss. Get help right away if:  You lose control of your bowel  or bladder (incontinence).  You have:  Weakness in your lower back, pelvis, buttocks, or legs that gets worse.  Redness or swelling of your back.  A burning sensation when you urinate. This information is not intended to replace advice given to you by your health care provider. Make sure you discuss any questions you have with your health care provider. Document Released: 03/28/2001 Document Revised: 09/07/2015 Document Reviewed: 12/11/2014 Elsevier Interactive Patient Education  2017 [employer]  

## 2017-01-02 ENCOUNTER — Ambulatory Visit: Payer: PPO | Admitting: Family Medicine

## 2017-01-02 DIAGNOSIS — H353113 Nonexudative age-related macular degeneration, right eye, advanced atrophic without subfoveal involvement: Secondary | ICD-10-CM | POA: Diagnosis not present

## 2017-01-02 DIAGNOSIS — H35421 Microcystoid degeneration of retina, right eye: Secondary | ICD-10-CM | POA: Diagnosis not present

## 2017-01-02 DIAGNOSIS — H442E3 Degenerative myopia with other maculopathy, bilateral eye: Secondary | ICD-10-CM | POA: Diagnosis not present

## 2017-01-02 DIAGNOSIS — H353122 Nonexudative age-related macular degeneration, left eye, intermediate dry stage: Secondary | ICD-10-CM | POA: Diagnosis not present

## 2017-01-12 ENCOUNTER — Encounter: Payer: Self-pay | Admitting: Family Medicine

## 2017-01-12 ENCOUNTER — Ambulatory Visit (INDEPENDENT_AMBULATORY_CARE_PROVIDER_SITE_OTHER): Payer: PPO | Admitting: Family Medicine

## 2017-01-12 VITALS — BP 148/70 | HR 54 | Temp 98.2°F | Wt 161.7 lb

## 2017-01-12 DIAGNOSIS — Z23 Encounter for immunization: Secondary | ICD-10-CM

## 2017-01-12 DIAGNOSIS — M5416 Radiculopathy, lumbar region: Secondary | ICD-10-CM | POA: Diagnosis not present

## 2017-01-12 NOTE — Patient Instructions (Signed)
We will set up MRI of lumbar spine to further assess.   

## 2017-01-12 NOTE — Progress Notes (Signed)
Subjective:     Patient ID: Bryan Norris, male   DOB: [birth year], 83 y.o.   MRN: [id number]  HPI Patient here with persistent right lower extremity pain. Was seen here couple weeks ago with dull pain extending mostly anterior thigh down below the knee. Occasional pain right lower lumbar to buttock. No reported injury. Denies any loss of sensory function. No urine or stool incontinence. Pain remains about 5 out of 10 intensity. He does think he is started having more difficulties with ambulation especially going downstairs or down hills  No appetite or weight change. No exacerbating features. He's had recent added stress of son who lives with him (who is 32 years old) had recent stroke and had have some type of heart procedure. His son is still in the hospital  Past Medical History:  Diagnosis Date  . Arthritis   . Blood in stool   . Diverticulitis   . Dyslipidemia   . Esophageal stricture   . FH: colonic polyps   . GI bleed   . High cholesterol   . IBS (irritable bowel syndrome)   . Macular degeneration   . MGUS (monoclonal gammopathy of unknown significance) 01/2004  . Rosacea 2009  . Urinary incontinence    Past Surgical History:  Procedure Laterality Date  . diviated septum    . INGUINAL HERNIA REPAIR    . ROTATOR CUFF REPAIR    . TONSILLECTOMY      reports that he has quit smoking. He has never used smokeless tobacco. He reports that he does not drink alcohol or use drugs. family history includes GI Bleed in his father; Heart disease in his brother, brother, and mother. Allergies  Allergen Reactions  . Flu Virus Vaccine     rash  . Tetanus Toxoids     Fever and chills     Review of Systems  Constitutional: Negative for activity change, appetite change and fever.  Respiratory: Negative for cough and shortness of breath.   Cardiovascular: Negative for chest pain and leg swelling.  Gastrointestinal: Negative for abdominal pain and vomiting.  Genitourinary: Negative for  dysuria, flank pain and hematuria.  Musculoskeletal: Positive for back pain. Negative for joint swelling.  Neurological: Negative for numbness.       Objective:   Physical Exam  Constitutional: He appears well-developed and well-nourished.  Cardiovascular: Normal rate and regular rhythm.   Pulmonary/Chest: Effort normal and breath sounds normal. No respiratory distress. He has no wheezes. He has no rales.  Musculoskeletal: He exhibits no edema.  Straight leg raise is negative bilaterally  Neurological:  Diminished right knee reflex compared to left. Ankle reflexes are symmetric. Question of some mild weakness with right knee extension compared with left       Assessment:     Right lower extremity pain. Suspect right lumbar radiculopathy. He does have diminished right knee reflex and possibly some mild quadriceps weakness on the right compared to left    Plan:     -Set up MRI scan to further assess -Follow-up immediately for any urine or stool incontinence or other acute neurologic changes  Eulas Post MD [facility] at [facility]

## 2017-01-21 ENCOUNTER — Ambulatory Visit
Admission: RE | Admit: 2017-01-21 | Discharge: 2017-01-21 | Disposition: A | Discharge status: Home / Self Care | Payer: PPO | Source: Ambulatory Visit | Attending: Family Medicine | Admitting: Family Medicine

## 2017-01-21 DIAGNOSIS — M48061 Spinal stenosis, lumbar region without neurogenic claudication: Secondary | ICD-10-CM | POA: Diagnosis not present

## 2017-01-21 DIAGNOSIS — M5416 Radiculopathy, lumbar region: Secondary | ICD-10-CM

## 2017-01-25 ENCOUNTER — Other Ambulatory Visit: Payer: Self-pay | Admitting: *Deleted

## 2017-01-25 DIAGNOSIS — M479 Spondylosis, unspecified: Secondary | ICD-10-CM

## 2017-01-29 DIAGNOSIS — M5416 Radiculopathy, lumbar region: Secondary | ICD-10-CM | POA: Diagnosis not present

## 2017-01-30 ENCOUNTER — Other Ambulatory Visit: Payer: PPO

## 2017-04-03 DIAGNOSIS — M5416 Radiculopathy, lumbar region: Secondary | ICD-10-CM | POA: Diagnosis not present

## 2017-04-25 ENCOUNTER — Encounter: Payer: Self-pay | Admitting: Family Medicine

## 2017-04-25 ENCOUNTER — Ambulatory Visit (INDEPENDENT_AMBULATORY_CARE_PROVIDER_SITE_OTHER): Payer: PPO | Admitting: Family Medicine

## 2017-04-25 VITALS — BP 140/80 | HR 55 | Temp 97.6°F | Wt 164.0 lb

## 2017-04-25 DIAGNOSIS — M48061 Spinal stenosis, lumbar region without neurogenic claudication: Secondary | ICD-10-CM

## 2017-04-25 MED ORDER — DICLOFENAC SODIUM 75 MG PO TBEC: 75.0000 mg | DELAYED_RELEASE_TABLET | Freq: Two times a day (BID) | ORAL | 0 refills | Status: DC

## 2017-04-25 NOTE — Progress Notes (Signed)
   Subjective:    Patient ID: Bryan Norris, male    DOB: 20-Jun-1924, 84 y.o.   MRN: [id number]  HPI Here for pain in the lower back and both legs. He sees Dr. Elease Hashimoto and saw Dr. Othelia Pulling, and he has tried Gabapentin 300 mg tid with no relief at all. An MRI in October revealed diffuse disc disease and arthritis in the lower spine.    Review of Systems  Constitutional: Negative.   Respiratory: Negative.   Cardiovascular: Negative.   Musculoskeletal: Positive for back pain and gait problem.       Objective:   Physical Exam  Constitutional: He is oriented to person, place, and time. He appears well-developed and well-nourished.  Walks with a cane, in pain   Cardiovascular: Normal rate, regular rhythm, normal heart sounds and intact distal pulses.  Pulmonary/Chest: Effort normal and breath sounds normal. No respiratory distress. He has no wheezes. He has no rales.  Neurological: He is alert and oriented to person, place, and time.          Assessment & Plan:  Lumbar spinal stenosis and osteoarthritis. Try Diclofenac bid with meals. Follow up with Dr. Elease Hashimoto.  Alysia Penna, MD

## 2017-05-07 DIAGNOSIS — M5416 Radiculopathy, lumbar region: Secondary | ICD-10-CM | POA: Diagnosis not present

## 2017-05-24 DIAGNOSIS — M5416 Radiculopathy, lumbar region: Secondary | ICD-10-CM | POA: Diagnosis not present

## 2017-06-07 DIAGNOSIS — M5416 Radiculopathy, lumbar region: Secondary | ICD-10-CM | POA: Diagnosis not present

## 2017-06-21 DIAGNOSIS — M5416 Radiculopathy, lumbar region: Secondary | ICD-10-CM | POA: Diagnosis not present

## 2017-07-16 ENCOUNTER — Ambulatory Visit (INDEPENDENT_AMBULATORY_CARE_PROVIDER_SITE_OTHER): Payer: PPO | Admitting: Family Medicine

## 2017-07-16 ENCOUNTER — Encounter: Payer: Self-pay | Admitting: Family Medicine

## 2017-07-16 ENCOUNTER — Ambulatory Visit: Payer: Self-pay | Admitting: *Deleted

## 2017-07-16 VITALS — BP 140/90 | HR 60 | Temp 97.7°F

## 2017-07-16 DIAGNOSIS — R0781 Pleurodynia: Secondary | ICD-10-CM | POA: Diagnosis not present

## 2017-07-16 DIAGNOSIS — Z9181 History of falling: Secondary | ICD-10-CM

## 2017-07-16 NOTE — Progress Notes (Signed)
Subjective:     Patient ID: Bryan Norris, male   DOB: Oct 25, 1924, 84 y.o.   MRN: [id number]  HPI Patient seen following recent fall. He has lumbar stenosis and has had progressive debility over several months. We had sent him for MRI back in October which showed multilevel spondylosis. He was referred to orthopedics. Trial gabapentin without much relief. He was eventually referred for epidurals and he's had 3 without much improvement. He is currently ambulating with walker.  One week ago he was with his walker and reached over to get something and lost his balance and fell. No loss of consciousness. He states he landed on the walker. He's had some left lateral rib pain since then. No bruising. Minimal pain with deep breathing. No dyspnea. No fever or chills. No cough. No headaches. No confusion.  No hip or other extremity pain.  Past Medical History:  Diagnosis Date  . Arthritis   . Blood in stool   . Diverticulitis   . Dyslipidemia   . Esophageal stricture   . FH: colonic polyps   . GI bleed   . High cholesterol   . IBS (irritable bowel syndrome)   . Macular degeneration   . MGUS (monoclonal gammopathy of unknown significance) 01/2004  . Rosacea 2009  . Urinary incontinence    Past Surgical History:  Procedure Laterality Date  . diviated septum    . INGUINAL HERNIA REPAIR    . ROTATOR CUFF REPAIR    . TONSILLECTOMY      reports that he has quit smoking. He has never used smokeless tobacco. He reports that he does not drink alcohol or use drugs. family history includes GI Bleed in his father; Heart disease in his brother, brother, and mother. Allergies  Allergen Reactions  . Flu Virus Vaccine     rash  . Tetanus Toxoids     Fever and chills  \   Review of Systems  Constitutional: Negative for chills and fever.  Respiratory: Negative for cough, shortness of breath and wheezing.   Cardiovascular: Negative for palpitations and leg swelling.  Gastrointestinal: Negative for  abdominal pain.  Neurological: Negative for dizziness, syncope and headaches.  Hematological: Does not bruise/bleed easily.  Psychiatric/Behavioral: Negative for confusion.       Objective:   Physical Exam  Constitutional: He is oriented to person, place, and time. He appears well-developed and well-nourished.  HENT:  Head: Normocephalic and atraumatic.  Cardiovascular: Normal rate.  Pulmonary/Chest: Effort normal and breath sounds normal. No respiratory distress. He has no wheezes. He has no rales.  Musculoskeletal: He exhibits no edema.  He has some tenderness left lateral rib cage area around the night and 10th rib region. No visible bruising or swelling.  Neurological: He is alert and oriented to person, place, and time.       Assessment:     #1 left lateral rib pain. Pain is relatively mild this point. We explained he may have rib fracture versus contusion  #2 lumbar stenosis  #3 progressive disability over recent months    Plan:     -Discussed pros and cons of x-ray but at this point we decided this would not likely change therapy. He will try some Tylenol for pain management and let us know if that is not adequate. Also encouraged to do frequent deep breathing to reduce risk of atelectasis -We discussed consideration for some physical therapy. He will consider this and let us know if interested -He is encouraged to use  walker all times -Continue close follow-up with orthopedic specialist -Routine follow-up in 3 months and sooner as needed  Eulas Post MD [facility] at [facility]

## 2017-07-16 NOTE — Patient Instructions (Signed)
You might have a rib fracture and we can do x-rays- but would not likely change treatment.  Try some Tylenol as needed for pain  Try to take some deep breaths periodically to help prevent pneumonia.

## 2017-07-16 NOTE — Telephone Encounter (Signed)
Pt  Fell   8  Days  Ago  Pt  Has  Pain  l   Side  Chest  /ribs  Worse  On movement  And  When takes  A  Deep  Breath .  Ptis  Alert  And  Oriented  Appearing in no  Acute  Distress.  Appt  Made  For today  With Dr Elease Hashimoto    Reason for Disposition . [1] After 72 hours AND [2] chest pain not improving  Answer Assessment - Initial Assessment Questions 1. MECHANISM: "How did the injury happen?"      Fall   While  Using  Walker    2. ONSET: "When did the injury happen?" (Minutes or hours ago)     8  Days    3. LOCATION: "Where on the chest is the injury located?"      L  Side  Chest  Below  Nipple    4. APPEARANCE: "What does the injury look like?"       No  Bruising  Not swollen     5. BLEEDING: "Is there any bleeding now? If so, ask: How long has it been bleeding?"     No  Bleeding   6. SEVERITY: "Any difficulty with breathing?"     No  Shortness of breath  7. SIZE: For cuts, bruises, or swelling, ask: "How large is it?" (e.g., inches or centimeters)     N/A  8. PAIN: "Is there pain?" If so, ask: "How bad is the pain?"   (e.g., Scale 1-10; or mild, moderate, severe)       8  When  When  Coughs     9. TETANUS: For any breaks in the skin, ask: "When was the last tetanus booster?"     n/a 10. PREGNANCY: "Is there any chance you are pregnant?" "When was your last menstrual period?"         N/A  Protocols used: CHEST INJURY-A-AH

## 2017-07-18 DIAGNOSIS — H353113 Nonexudative age-related macular degeneration, right eye, advanced atrophic without subfoveal involvement: Secondary | ICD-10-CM | POA: Diagnosis not present

## 2017-07-18 DIAGNOSIS — H35421 Microcystoid degeneration of retina, right eye: Secondary | ICD-10-CM | POA: Diagnosis not present

## 2017-07-18 DIAGNOSIS — H15833 Staphyloma posticum, bilateral: Secondary | ICD-10-CM | POA: Diagnosis not present

## 2017-07-18 DIAGNOSIS — H353122 Nonexudative age-related macular degeneration, left eye, intermediate dry stage: Secondary | ICD-10-CM | POA: Diagnosis not present

## 2017-07-30 DIAGNOSIS — M5416 Radiculopathy, lumbar region: Secondary | ICD-10-CM | POA: Diagnosis not present

## 2017-08-02 DIAGNOSIS — R262 Difficulty in walking, not elsewhere classified: Secondary | ICD-10-CM | POA: Diagnosis not present

## 2017-08-02 DIAGNOSIS — M5416 Radiculopathy, lumbar region: Secondary | ICD-10-CM | POA: Diagnosis not present

## 2017-08-02 DIAGNOSIS — M48062 Spinal stenosis, lumbar region with neurogenic claudication: Secondary | ICD-10-CM | POA: Diagnosis not present

## 2017-08-02 DIAGNOSIS — R296 Repeated falls: Secondary | ICD-10-CM | POA: Diagnosis not present

## 2017-08-06 DIAGNOSIS — M48062 Spinal stenosis, lumbar region with neurogenic claudication: Secondary | ICD-10-CM | POA: Diagnosis not present

## 2017-08-23 DIAGNOSIS — M48062 Spinal stenosis, lumbar region with neurogenic claudication: Secondary | ICD-10-CM | POA: Diagnosis not present

## 2017-08-23 DIAGNOSIS — M48061 Spinal stenosis, lumbar region without neurogenic claudication: Secondary | ICD-10-CM | POA: Diagnosis not present

## 2017-10-05 ENCOUNTER — Ambulatory Visit: Payer: PPO

## 2017-10-15 ENCOUNTER — Ambulatory Visit (INDEPENDENT_AMBULATORY_CARE_PROVIDER_SITE_OTHER): Payer: PPO | Admitting: Family Medicine

## 2017-10-15 VITALS — BP 146/62 | HR 70 | Temp 97.6°F | Wt 155.1 lb

## 2017-10-15 DIAGNOSIS — Z9181 History of falling: Secondary | ICD-10-CM | POA: Diagnosis not present

## 2017-10-15 DIAGNOSIS — E785 Hyperlipidemia, unspecified: Secondary | ICD-10-CM | POA: Diagnosis not present

## 2017-10-15 DIAGNOSIS — I10 Essential (primary) hypertension: Secondary | ICD-10-CM

## 2017-10-15 MED ORDER — AMLODIPINE BESY-BENAZEPRIL HCL 5-10 MG PO CAPS: 1.0000 | ORAL_CAPSULE | Freq: Every day | ORAL | 3 refills | Status: DC

## 2017-10-15 MED ORDER — ROSUVASTATIN CALCIUM 10 MG PO TABS: ORAL_TABLET | ORAL | 3 refills | Status: DC

## 2017-10-15 NOTE — Patient Instructions (Addendum)
Get back on the Crestor and Lotrel once daily.  Let's plan on 2 month follow up and will get labs then.

## 2017-10-15 NOTE — Progress Notes (Signed)
Subjective:     Patient ID: Bryan Norris, male   DOB: 06-23-1924, 84 y.o.   MRN: [id number]  HPI  Patient seen for medical follow-up. He had recent fall and rib pain which is finally improved and resolved at this time. [place] Patient seems somewhat uncertain of medications. He is supposed to be on Crestor and Lotrel but apparently ran out of both those sometime back. He does not think he has been taking either one of them recently. Does not monitor blood pressures at home. No recent chest pains or dyspnea. Appetite is stable though his weight is down about 10 pounds from last year  His major issue is lumbar stenosis and increasing debility from that. His had injections the past. He is obviously not a good surgical candidate because of age. He plans to get back into see orthopedic specialist soon regarding that. Denies any recurrent falls since last visit  Past Medical History:  Diagnosis Date  . Arthritis   . Blood in stool   . Diverticulitis   . Dyslipidemia   . Esophageal stricture   . FH: colonic polyps   . GI bleed   . High cholesterol   . IBS (irritable bowel syndrome)   . Macular degeneration   . MGUS (monoclonal gammopathy of unknown significance) 01/2004  . Rosacea 2009  . Urinary incontinence    Past Surgical History:  Procedure Laterality Date  . diviated septum    . INGUINAL HERNIA REPAIR    . ROTATOR CUFF REPAIR    . TONSILLECTOMY      reports that he has quit smoking. He has never used smokeless tobacco. He reports that he does not drink alcohol or use drugs. family history includes GI Bleed in his father; Heart disease in his brother, brother, and mother. Allergies  Allergen Reactions  . Flu Virus Vaccine     rash  . Tetanus Toxoids     Fever and chills    Review of Systems  Constitutional: Negative for chills, fatigue and fever.  Eyes: Negative for visual disturbance.  Respiratory: Negative for cough, chest  tightness and shortness of breath.   Cardiovascular: Negative for chest pain, palpitations and leg swelling.  Gastrointestinal: Negative for abdominal pain.  Musculoskeletal: Positive for arthralgias and back pain.  Neurological: Negative for dizziness, syncope, weakness, light-headedness and headaches.       Objective:   Physical Exam  Constitutional: He is oriented to person, place, and time. He appears well-developed and well-nourished.  HENT:  Right Ear: External ear normal.  Left Ear: External ear normal.  Mouth/Throat: Oropharynx is clear and moist.  Eyes: Pupils are equal, round, and reactive to light.  Neck: Neck supple. No thyromegaly present.  Cardiovascular: Normal rate and regular rhythm.  Pulmonary/Chest: Effort normal and breath sounds normal. No respiratory distress. He has no wheezes. He has no rales.  Musculoskeletal: He exhibits no edema.  Neurological: He is alert and oriented to person, place, and time.       Assessment:     #1 hypertension. Slightly elevated reading today but patient apparently is off his blood pressure medication  #2 dyslipidemia-currently not on Crestor.  #3 high-risk for falls  #4 lumbar stenosis    Plan:     -Refill Crestor and Lotrel for one year -Reassess in 2 months and obtain lab work at that point including basic metabolic panel, lipid panel, hepatic panel -We offered some home physical therapy  and at this point he wishes to wait  Eulas Post MD [facility] at [facility]

## 2017-12-24 ENCOUNTER — Ambulatory Visit (INDEPENDENT_AMBULATORY_CARE_PROVIDER_SITE_OTHER): Payer: PPO | Admitting: Family Medicine

## 2017-12-24 ENCOUNTER — Encounter: Payer: Self-pay | Admitting: Family Medicine

## 2017-12-24 VITALS — BP 142/78 | HR 70 | Temp 98.0°F | Wt 165.4 lb

## 2017-12-24 DIAGNOSIS — J301 Allergic rhinitis due to pollen: Secondary | ICD-10-CM

## 2017-12-24 DIAGNOSIS — Z23 Encounter for immunization: Secondary | ICD-10-CM

## 2017-12-24 DIAGNOSIS — I10 Essential (primary) hypertension: Secondary | ICD-10-CM | POA: Diagnosis not present

## 2017-12-24 DIAGNOSIS — H6121 Impacted cerumen, right ear: Secondary | ICD-10-CM | POA: Diagnosis not present

## 2017-12-24 DIAGNOSIS — E785 Hyperlipidemia, unspecified: Secondary | ICD-10-CM

## 2017-12-24 DIAGNOSIS — L219 Seborrheic dermatitis, unspecified: Secondary | ICD-10-CM | POA: Diagnosis not present

## 2017-12-24 LAB — HEPATIC FUNCTION PANEL
ALBUMIN: 4.9 g/dL (ref 3.5–5.2)
ALT: 15 U/L (ref 0–53)
AST: 17 U/L (ref 0–37)
Alkaline Phosphatase: 72 U/L (ref 39–117)
Bilirubin, Direct: 0.1 mg/dL (ref 0.0–0.3)
TOTAL PROTEIN: 7.2 g/dL (ref 6.0–8.3)
Total Bilirubin: 0.8 mg/dL (ref 0.2–1.2)

## 2017-12-24 LAB — LIPID PANEL
Cholesterol: 185 mg/dL (ref 0–200)
HDL: 39.5 mg/dL (ref >39.00)
LDL Cholesterol: 115 mg/dL — ABNORMAL HIGH (ref 0–99)
NONHDL: 145.04
TRIGLYCERIDES: 149 mg/dL (ref 0.0–149.0)
Total CHOL/HDL Ratio: 5
VLDL: 29.8 mg/dL (ref 0.0–40.0)

## 2017-12-24 LAB — BASIC METABOLIC PANEL
BUN: 17 mg/dL (ref 6–23)
CO2: 30 meq/L (ref 19–32)
Calcium: 9.7 mg/dL (ref 8.4–10.5)
Chloride: 103 mEq/L (ref 96–112)
Creatinine, Ser: 1.12 mg/dL (ref 0.40–1.50)
GFR: 65.25 mL/min (ref >60.00)
GLUCOSE: 107 mg/dL — AB (ref 70–99)
POTASSIUM: 4.7 meq/L (ref 3.5–5.1)
SODIUM: 140 meq/L (ref 135–145)

## 2017-12-24 MED ORDER — TRIAMCINOLONE ACETONIDE 0.1 % EX CREA: 1.0000 "application " | TOPICAL_CREAM | Freq: Two times a day (BID) | CUTANEOUS | 2 refills | Status: AC | PRN

## 2017-12-24 NOTE — Progress Notes (Signed)
Subjective:     Patient ID: Bryan Norris, male   DOB: 1924/07/04, 84 y.o.   MRN: [id number]  HPI Patient seen for medical follow-up. He has history of hypertension, hyperlipidemia, lumbar stenosis.  He states he is compliant with medications but his memory does seem to be fuzzy at times. Is taking currently Lotrel for hypertension and Crestor for hyperlipidemia. We planned follow-up labs today. Denies any headaches or chest pains. He has seen physical therapy regarding his falls and was given some home exercises.  Right ear fullness past several weeks. He is concerned about cerumen impaction. No drainage. No ear pain.  Scaly facial rash. He's tried moisturizers without improvement. Mild pruritus.  Hx of rosacea, but this rash is different.    Past Medical History:  Diagnosis Date  . Arthritis   . Blood in stool   . Diverticulitis   . Dyslipidemia   . Esophageal stricture   . FH: colonic polyps   . GI bleed   . High cholesterol   . IBS (irritable bowel syndrome)   . Macular degeneration   . MGUS (monoclonal gammopathy of unknown significance) 01/2004  . Rosacea 2009  . Urinary incontinence    Past Surgical History:  Procedure Laterality Date  . diviated septum    . INGUINAL HERNIA REPAIR    . ROTATOR CUFF REPAIR    . TONSILLECTOMY      reports that he has quit smoking. He has never used smokeless tobacco. He reports that he does not drink alcohol or use drugs. family history includes GI Bleed in his father; Heart disease in his brother, brother, and mother. Allergies  Allergen Reactions  . Flu Virus Vaccine     rash  . Tetanus Toxoids     Fever and chills     Review of Systems  Constitutional: Negative for fatigue and unexpected weight change.  HENT: Positive for hearing loss. Negative for ear discharge and ear pain.   Eyes: Negative for visual disturbance.  Respiratory: Negative for cough, chest tightness and shortness of breath.   Cardiovascular: Negative for chest  pain, palpitations and leg swelling.  Skin: Positive for rash.  Neurological: Negative for dizziness, syncope, weakness, light-headedness and headaches.       Objective:   Physical Exam  Constitutional: He is oriented to person, place, and time. He appears well-developed and well-nourished.  HENT:  Left Ear: External ear normal.  Mouth/Throat: Oropharynx is clear and moist.  Cerumen impaction right canal. Left canal is clear  Eyes: Pupils are equal, round, and reactive to light.  Neck: Neck supple. No thyromegaly present.  Cardiovascular: Normal rate and regular rhythm.  Pulmonary/Chest: Effort normal and breath sounds normal. No respiratory distress. He has no wheezes. He has no rales.  Musculoskeletal: He exhibits no edema.  Neurological: He is alert and oriented to person, place, and time.  Skin: Rash noted.  Patient has diffuse scaly rash involving the face. He does have history of reported rosacea with this rash is different       Assessment:     #1 hypertension. Marginal control by today's reading  #2 hyperlipidemia  #3 cerumen impaction right ear canal-removed with irrigation.  #4 probable seborrheic dermatitis involving the face  #5 seasonal and perennial allergic rhinitis    Plan:     -check labs with basic metabolic panel, lipid panel, hepatic panel -Flu vaccine given -Irrigation of right ear- with removal of cerumen.   -Triamcinolone 0.1% cream use once or twice daily  as needed for facial rash with avoidance to the eyes -he is encouraged to get back on daily Flonase  Eulas Post MD [facility] at [facility]

## 2018-01-04 ENCOUNTER — Telehealth: Payer: Self-pay | Admitting: Family Medicine

## 2018-01-04 NOTE — Telephone Encounter (Signed)
Copied from [place] [id number]. Topic: General - Other >> Jan 04, 2018 11:19 AM Alfredia Ferguson R wrote: Pt is calling back in to speak with Apolonio Schneiders  CB# [phone]

## 2018-01-04 NOTE — Telephone Encounter (Signed)
Spoke with patient and reviewed lab results. 

## 2018-02-07 DIAGNOSIS — M48061 Spinal stenosis, lumbar region without neurogenic claudication: Secondary | ICD-10-CM | POA: Diagnosis not present

## 2018-03-11 ENCOUNTER — Ambulatory Visit (INDEPENDENT_AMBULATORY_CARE_PROVIDER_SITE_OTHER): Payer: PPO

## 2018-03-11 ENCOUNTER — Encounter: Payer: Self-pay | Admitting: Family Medicine

## 2018-03-11 ENCOUNTER — Ambulatory Visit (INDEPENDENT_AMBULATORY_CARE_PROVIDER_SITE_OTHER): Payer: PPO | Admitting: Family Medicine

## 2018-03-11 VITALS — BP 110/68 | HR 103 | Temp 97.5°F | Wt 175.5 lb

## 2018-03-11 DIAGNOSIS — M25562 Pain in left knee: Secondary | ICD-10-CM

## 2018-03-11 DIAGNOSIS — S82109A Unspecified fracture of upper end of unspecified tibia, initial encounter for closed fracture: Secondary | ICD-10-CM

## 2018-03-11 DIAGNOSIS — M25462 Effusion, left knee: Secondary | ICD-10-CM | POA: Diagnosis not present

## 2018-03-11 NOTE — Progress Notes (Signed)
  Subjective:     Patient ID: Bryan Norris, male   DOB: 14-May-1924, 84 y.o.   MRN: [id number]  HPI Patient seen with left knee injury.  This occurred about 3 weeks ago.  He was transferring from his walker to a chair and twisted and somehow went down.  He recalls landing on his left knee.  Has had some pain with ambulation since then.  Still ambulating with cane or walker at home.  Does not recall any bruising.  Possibly some mild swelling.  Has not take anything for the pain.  Pain is mostly medial location.  No locking or giving way.  Past Medical History:  Diagnosis Date  . Arthritis   . Blood in stool   . Diverticulitis   . Dyslipidemia   . Esophageal stricture   . FH: colonic polyps   . GI bleed   . High cholesterol   . IBS (irritable bowel syndrome)   . Macular degeneration   . MGUS (monoclonal gammopathy of unknown significance) 01/2004  . Rosacea 2009  . Urinary incontinence    Past Surgical History:  Procedure Laterality Date  . diviated septum    . INGUINAL HERNIA REPAIR    . ROTATOR CUFF REPAIR    . TONSILLECTOMY      reports that he has quit smoking. He has never used smokeless tobacco. He reports that he does not drink alcohol or use drugs. family history includes GI Bleed in his father; Heart disease in his brother, brother, and mother. Allergies  Allergen Reactions  . Flu Virus Vaccine     rash  . Tetanus Toxoids     Fever and chills     Review of Systems  Cardiovascular: Negative for leg swelling.  Neurological: Negative for weakness.       Objective:   Physical Exam  Constitutional: He appears well-developed and well-nourished.  Cardiovascular: Normal rate.  Pulmonary/Chest: Effort normal and breath sounds normal.  Musculoskeletal:  Left knee reveals small effusion.  No warmth.  No erythema.  No ecchymosis.  Full range of motion.  Mild medial joint line tenderness.  No patellar tenderness.       Assessment:     Left knee pain following fall.   He does have small effusion.  Question meniscal injury    Plan:     -We will obtain x-rays with history of recent trauma (has benign appearing exostosis off tibia)  This will be over-read.  -recommend icing 20 minutes 2 times daily -offered injection and he wishes to wait at this time -touch base in 2 weeks if no better.  Eulas Post MD [facility] at [facility]

## 2018-03-11 NOTE — Patient Instructions (Signed)
Try some icing 20 minutes two to three times daily  Touch base if not better in 2-3 weeks.

## 2018-03-12 NOTE — Addendum Note (Signed)
Addended by: Gwenyth Ober R on: 03/12/2018 08:10 AM   Modules accepted: Orders

## 2018-03-25 ENCOUNTER — Ambulatory Visit (INDEPENDENT_AMBULATORY_CARE_PROVIDER_SITE_OTHER): Payer: PPO | Admitting: Physician Assistant

## 2018-03-25 ENCOUNTER — Encounter (INDEPENDENT_AMBULATORY_CARE_PROVIDER_SITE_OTHER): Payer: Self-pay | Admitting: Physician Assistant

## 2018-03-25 ENCOUNTER — Ambulatory Visit (INDEPENDENT_AMBULATORY_CARE_PROVIDER_SITE_OTHER): Payer: PPO

## 2018-03-25 DIAGNOSIS — M25562 Pain in left knee: Secondary | ICD-10-CM
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
## 2018-04-01 ENCOUNTER — Ambulatory Visit: Payer: PPO | Admitting: Family Medicine

## 2018-04-19 DIAGNOSIS — H35371 Puckering of macula, right eye: Secondary | ICD-10-CM | POA: Diagnosis not present

## 2018-04-19 DIAGNOSIS — H353122 Nonexudative age-related macular degeneration, left eye, intermediate dry stage: Secondary | ICD-10-CM | POA: Diagnosis not present

## 2018-04-19 DIAGNOSIS — H442E3 Degenerative myopia with other maculopathy, bilateral eye: Secondary | ICD-10-CM | POA: Diagnosis not present

## 2018-04-19 DIAGNOSIS — H353113 Nonexudative age-related macular degeneration, right eye, advanced atrophic without subfoveal involvement: Secondary | ICD-10-CM | POA: Diagnosis not present

## 2018-06-24 ENCOUNTER — Ambulatory Visit: Payer: PPO | Admitting: Family Medicine

## 2018-07-02 ENCOUNTER — Ambulatory Visit: Payer: PPO | Admitting: Family Medicine

## 2018-07-02 ENCOUNTER — Ambulatory Visit: Payer: PPO | Admitting: Internal Medicine

## 2018-11-26 DIAGNOSIS — H353113 Nonexudative age-related macular degeneration, right eye, advanced atrophic without subfoveal involvement: Secondary | ICD-10-CM | POA: Diagnosis not present

## 2018-11-26 DIAGNOSIS — H353122 Nonexudative age-related macular degeneration, left eye, intermediate dry stage: Secondary | ICD-10-CM | POA: Diagnosis not present

## 2018-11-26 DIAGNOSIS — H35371 Puckering of macula, right eye: Secondary | ICD-10-CM | POA: Diagnosis not present

## 2018-11-26 DIAGNOSIS — H15833 Staphyloma posticum, bilateral: Secondary | ICD-10-CM | POA: Diagnosis not present

## 2019-03-03 ENCOUNTER — Encounter: Payer: Self-pay | Admitting: Family Medicine

## 2019-03-03 ENCOUNTER — Ambulatory Visit (INDEPENDENT_AMBULATORY_CARE_PROVIDER_SITE_OTHER): Payer: PPO | Admitting: Family Medicine

## 2019-03-03 ENCOUNTER — Other Ambulatory Visit: Payer: Self-pay

## 2019-03-03 VITALS — BP 150/84 | HR 54 | Temp 98.0°F | Wt 166.4 lb

## 2019-03-03 DIAGNOSIS — I10 Essential (primary) hypertension: Secondary | ICD-10-CM | POA: Diagnosis not present

## 2019-03-03 DIAGNOSIS — J309 Allergic rhinitis, unspecified: Secondary | ICD-10-CM

## 2019-03-03 DIAGNOSIS — E785 Hyperlipidemia, unspecified: Secondary | ICD-10-CM

## 2019-03-03 MED ORDER — AMLODIPINE BESY-BENAZEPRIL HCL 5-10 MG PO CAPS: 1.0000 | ORAL_CAPSULE | Freq: Every day | ORAL | 3 refills | Status: DC

## 2019-03-03 MED ORDER — ROSUVASTATIN CALCIUM 10 MG PO TABS: ORAL_TABLET | ORAL | 3 refills | Status: DC

## 2019-03-03 NOTE — Progress Notes (Signed)
Subjective:     Patient ID: Bryan Norris, male   DOB: 15-Jun-1924, 85 y.o.   MRN: [id number]  HPI Bryan Norris is here to discuss the following issues  His major concern is postnasal drip which he states has gone on for a "long time".  He has perennial allergy type symptoms and he thinks most of this is dust.  He has taken Flonase and Claritin in the past but is not currently taking either.  He denies any infectious symptoms such as fever, facial pain, purulent discharge.  He has hypertension and is taking Lotrel in the past but apparently ran out and never went to get prescription refilled.  Also has hyperlipidemia and has been on low-dose Crestor but also not taking that regularly.  He has taken Myrbetriq in the past for urine urgency but is off that.  He does have some urine leakage time to time.  Denies any recent headaches or chest pains.  Appetite is fair  Occasional low back pain which he thinks is related to coughing from his allergies.  No radiculitis symptoms.  He has had reported skin rash previously with flu vaccine and does not take those.  Wt Readings from Last 3 Encounters:  03/03/19 166 lb 6.4 oz (75.5 kg)  03/11/18 175 lb 8 oz (79.6 kg)  12/24/17 165 lb 6.4 oz (75 kg)   Past Medical History:  Diagnosis Date  . Arthritis   . Blood in stool   . Diverticulitis   . Dyslipidemia   . Esophageal stricture   . FH: colonic polyps   . GI bleed   . High cholesterol   . IBS (irritable bowel syndrome)   . Macular degeneration   . MGUS (monoclonal gammopathy of unknown significance) 01/2004  . Rosacea 2009  . Urinary incontinence    Past Surgical History:  Procedure Laterality Date  . diviated septum    . INGUINAL HERNIA REPAIR    . ROTATOR CUFF REPAIR    . TONSILLECTOMY      reports that he has quit smoking. He has never used smokeless tobacco. He reports that he does not drink alcohol or use drugs. family history includes GI Bleed in his father; Heart disease in his  brother, brother, and mother. Allergies  Allergen Reactions  . Flu Virus Vaccine     rash  . Tetanus Toxoids     Fever and chills      Review of Systems  Constitutional: Negative for chills, fatigue and fever.  HENT: Positive for congestion, postnasal drip and rhinorrhea. Negative for sinus pressure and sinus pain.   Eyes: Negative for visual disturbance.  Respiratory: Negative for chest tightness, shortness of breath and wheezing.   Cardiovascular: Negative for chest pain, palpitations and leg swelling.  Endocrine: Negative for polydipsia and polyuria.  Neurological: Negative for dizziness, syncope, weakness, light-headedness and headaches.       Objective:   Physical Exam Constitutional:      Appearance: He is well-developed.  HENT:     Right Ear: External ear normal.     Left Ear: External ear normal.  Eyes:     Pupils: Pupils are equal, round, and reactive to light.  Neck:     Musculoskeletal: Neck supple.     Thyroid: No thyromegaly.  Cardiovascular:     Rate and Rhythm: Normal rate and regular rhythm.  Pulmonary:     Effort: Pulmonary effort is normal. No respiratory distress.     Breath sounds: Normal breath sounds.  No wheezing or rales.  Musculoskeletal:     Right lower leg: No edema.     Left lower leg: No edema.  Neurological:     Mental Status: He is alert and oriented to person, place, and time.        Assessment:     #1 persistent daily postnasal drip symptoms.  Suspect perennial allergies  #2 hypertension which is currently untreated and uncontrolled  #3 hyperlipidemia currently not treated.  He has tolerated Crestor in the past without difficulty    Plan:     -Get back on Crestor and Lotrel with prescriptions sent.  We recommend a 30-month follow-up after getting back on these medications and follow-up labs in 3 months -He cannot take flu vaccine secondary to previous intolerance -Recommend over-the-counter Claritin 10 mg daily and Flonase  daily for allergic postnasal drip symptoms.  Be in touch if this is not adequately relieving symptoms  Bryan Post MD [facility] at [facility]

## 2019-03-03 NOTE — Patient Instructions (Signed)
Get back on Flonase and Claritan and take these daily  Let's plan on 3 month follow up after getting back on the blood pressure and cholesterol medications.

## 2019-03-07 ENCOUNTER — Other Ambulatory Visit: Payer: Self-pay

## 2019-05-06 IMAGING — DX DG KNEE COMPLETE 4+V*L*
4 series · 4 of 4 positions shown · non-contrast
Comparison: None.

CLINICAL DATA: Fall 4 weeks ago with knee pain, initial encounter

EXAM:
LEFT KNEE - COMPLETE 4+ VIEW

[knee ap (1 of 3)]
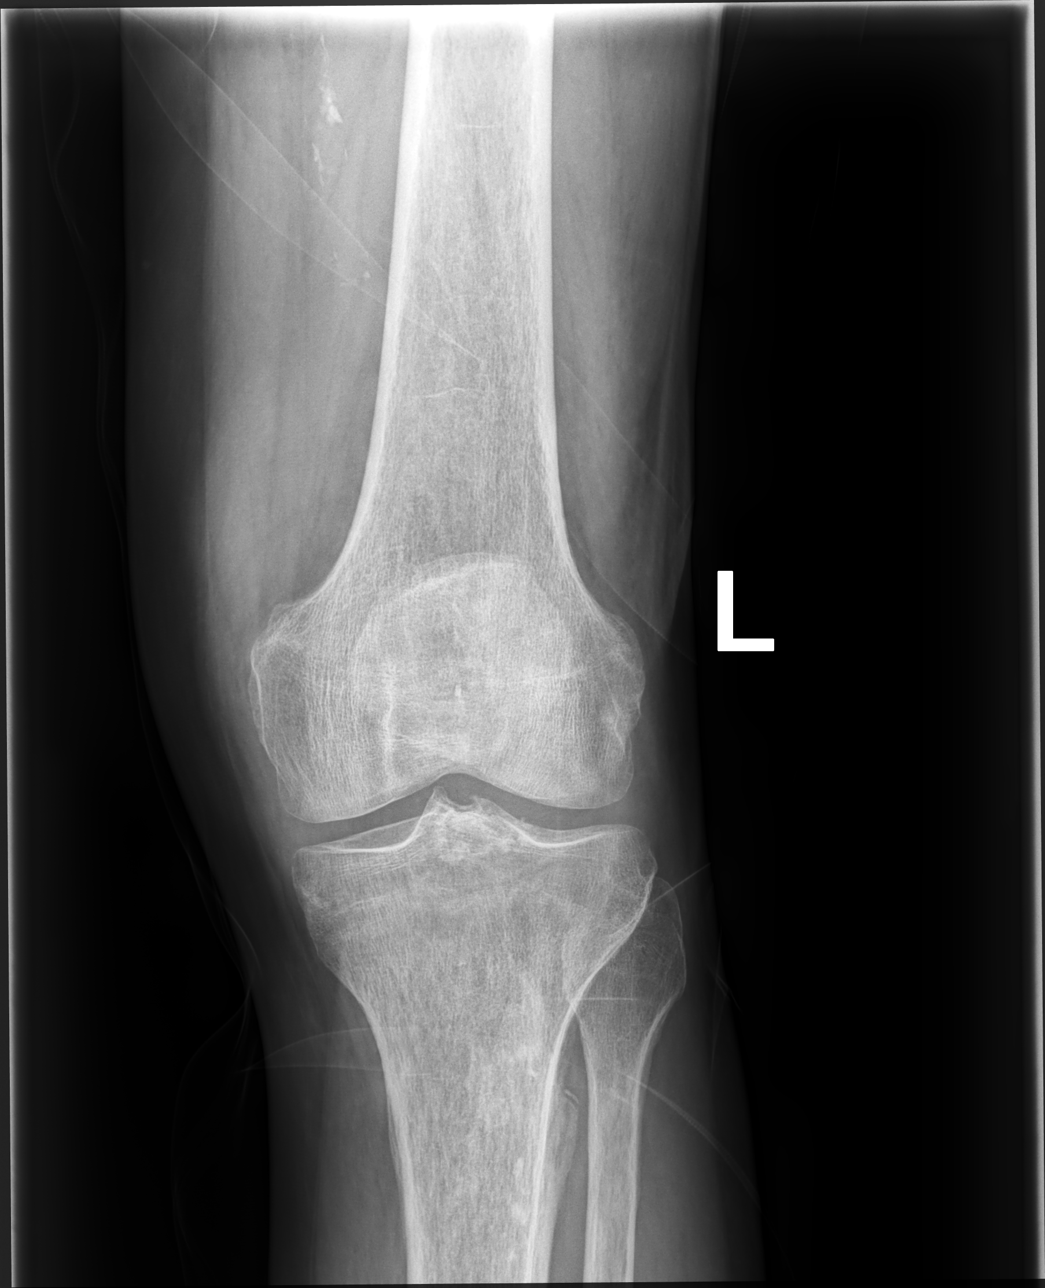

[knee ap (2 of 3)]
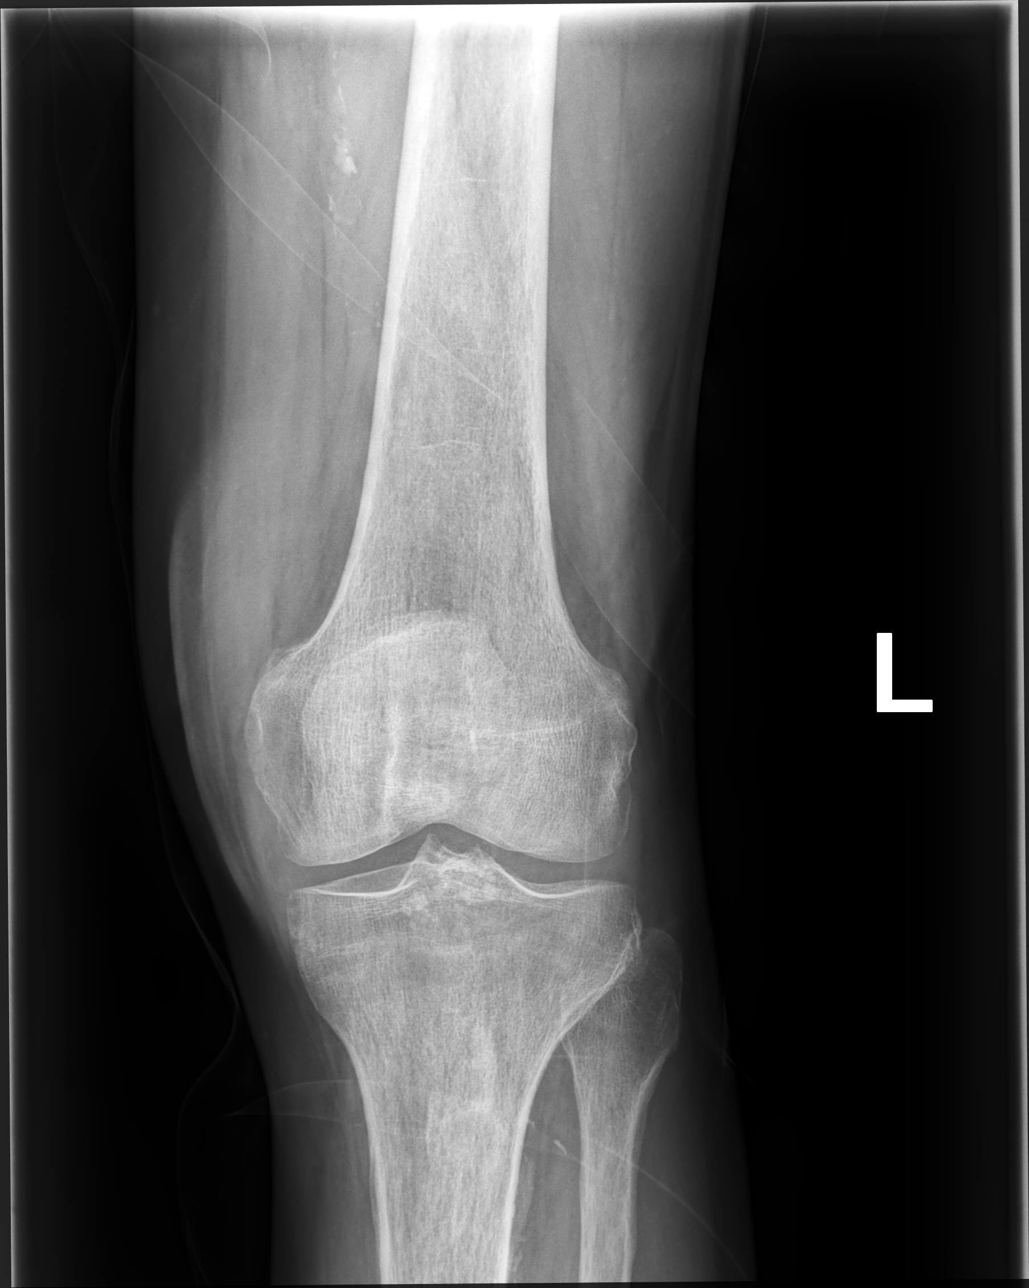

[knee ap (3 of 3)]
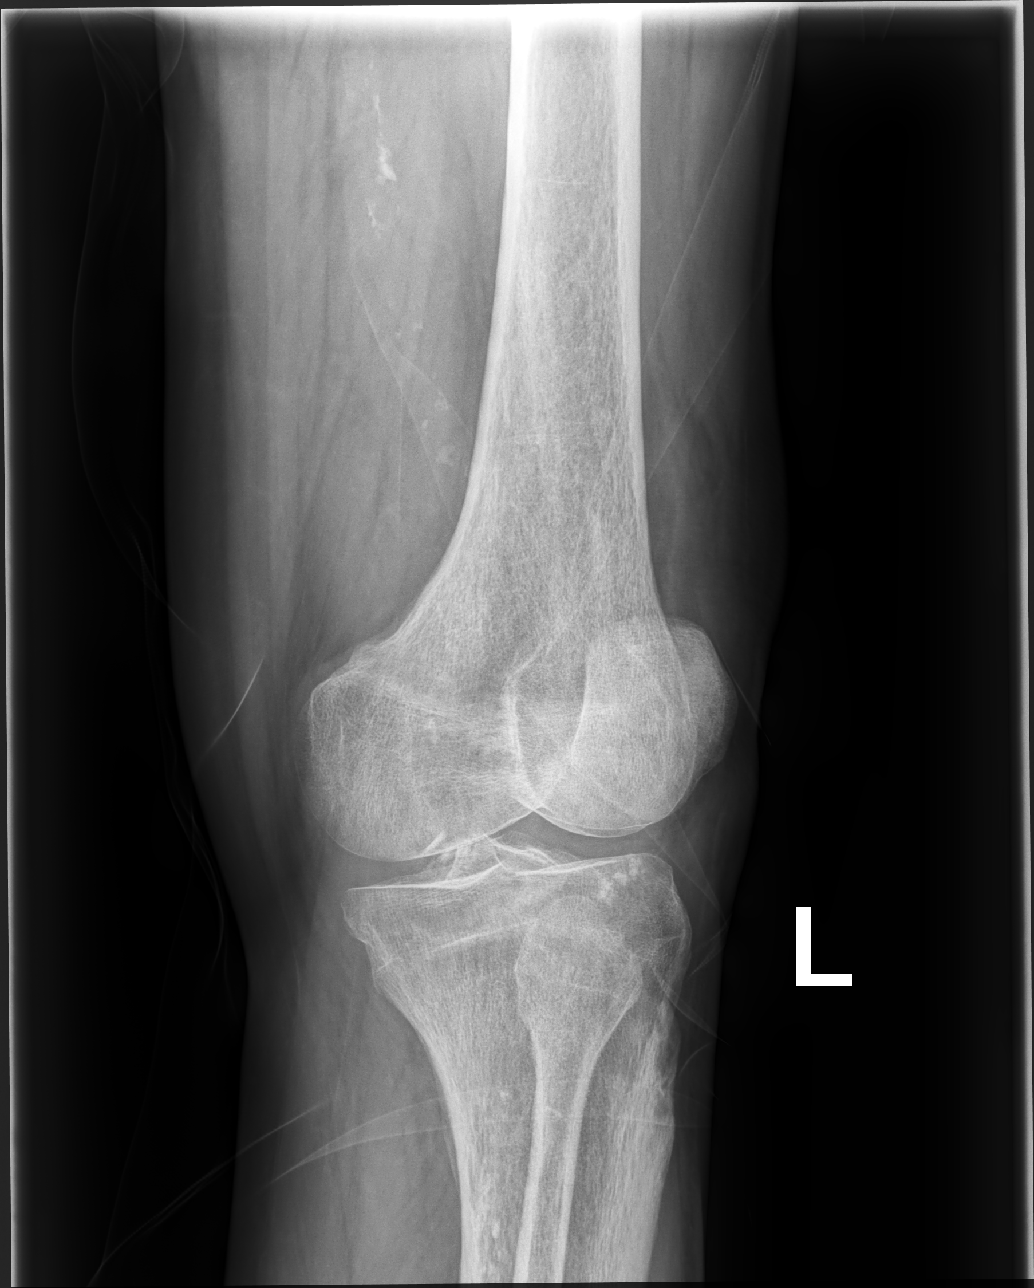

[knee lat]
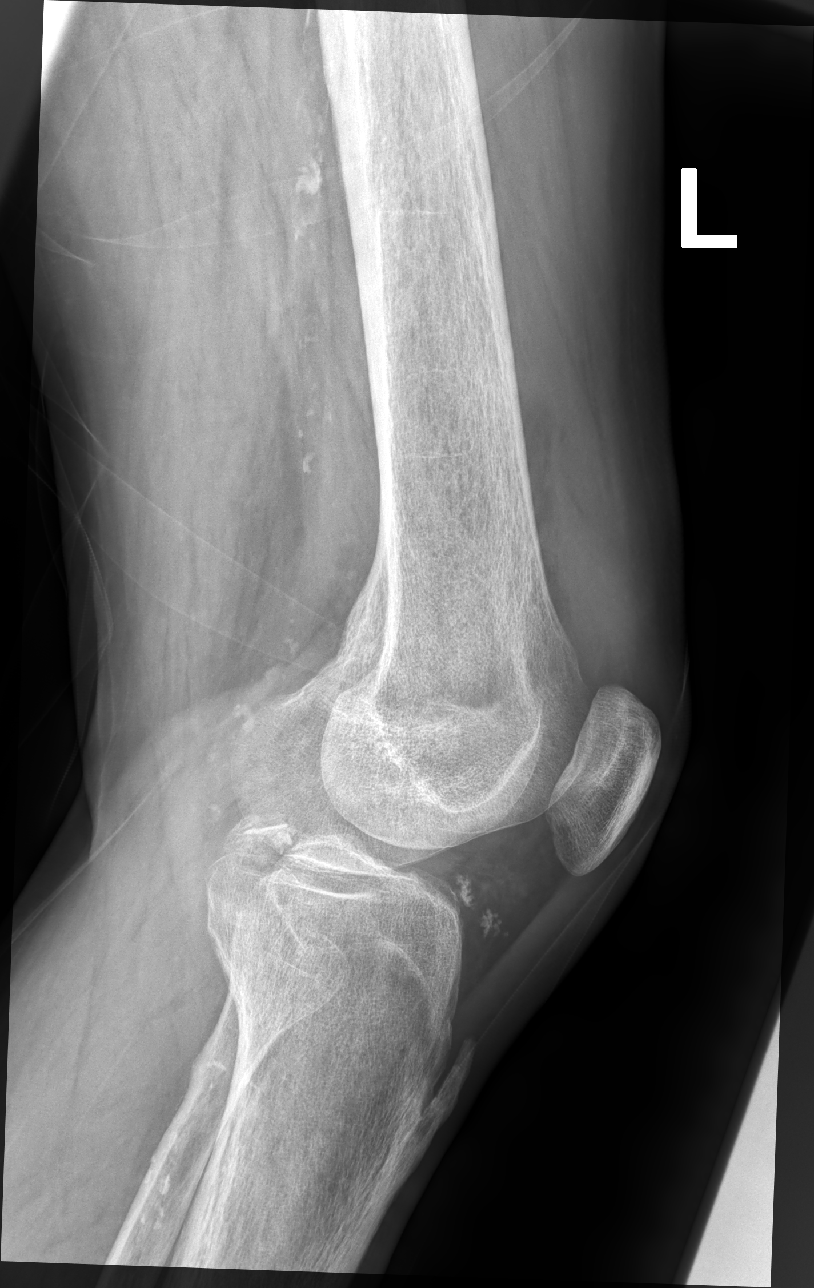

[4 of 4 positions shown; findings below may reference images not displayed]

FINDINGS: Mild osteopenia is noted. Joint effusion is present. There is a
lucency in the posterior aspect of the proximal tibia consistent
with a fracture through the posterior aspect of the tibia likely
involving the intercondylar eminence. No other fractures are seen.
IMPRESSION: Lucency identified in the posterior aspect of the proximal tibia
with mild displacement of the fracture fragment. Associated joint
effusion is noted.

## 2019-06-03 ENCOUNTER — Ambulatory Visit: Payer: PPO | Admitting: Family Medicine

## 2019-07-28 ENCOUNTER — Telehealth: Payer: Self-pay | Admitting: Family Medicine

## 2019-07-28 NOTE — Telephone Encounter (Signed)
Please advise 

## 2019-07-28 NOTE — Telephone Encounter (Signed)
The patient had a question about the COVID vaccinations. He wanted to know what's the difference if you are only 91% after 1st vaccine and 94% after 2nd vaccine.   Please advise

## 2019-07-29 NOTE — Telephone Encounter (Signed)
Pt said he had moderna shot and will get his second one

## 2019-07-29 NOTE — Telephone Encounter (Signed)
I recommend either the [place] or Moderna vaccines.  Even though there is some immunity with one, he should try to get both for optimal immunity.

## 2019-09-22 ENCOUNTER — Encounter: Payer: Self-pay | Admitting: Family Medicine

## 2019-09-22 ENCOUNTER — Ambulatory Visit (INDEPENDENT_AMBULATORY_CARE_PROVIDER_SITE_OTHER): Payer: PPO | Admitting: Family Medicine

## 2019-09-22 ENCOUNTER — Other Ambulatory Visit: Payer: Self-pay

## 2019-09-22 VITALS — BP 120/66 | HR 66 | Temp 97.9°F | Wt 169.6 lb

## 2019-09-22 DIAGNOSIS — H6123 Impacted cerumen, bilateral: Secondary | ICD-10-CM

## 2019-09-22 NOTE — Patient Instructions (Signed)
Apply a few drops of mineral oil to the left ear canal and leave in for at least 30 minutes and then gently irrigate with lukewarm water with rubber bulb syringe.  May do this several days consecutive if necessary until wax removed

## 2019-09-22 NOTE — Progress Notes (Signed)
Established Patient Office Visit  Subjective:  Patient ID: Bryan Norris, male    DOB: 06-Apr-1925  Age: 86 y.o. MRN: [id number]  CC:  Chief Complaint  Patient presents with  . hearing issues    Pt states for about month has been having hearing issues for about a month now states its clogged up     HPI Bryan Norris presents for decreased hearing bilaterally.  He has baseline of decreased hearing but has had worsening over the past several weeks and he thinks this is probably related to cerumen impaction.  Has had similar issues previously.  He denies any vertigo.  No ear pain.  No ear drainage.  No alleviating factors.  Past Medical History:  Diagnosis Date  . Arthritis   . Blood in stool   . Diverticulitis   . Dyslipidemia   . Esophageal stricture   . FH: colonic polyps   . GI bleed   . High cholesterol   . IBS (irritable bowel syndrome)   . Macular degeneration   . MGUS (monoclonal gammopathy of unknown significance) 01/2004  . Rosacea 2009  . Urinary incontinence     Past Surgical History:  Procedure Laterality Date  . diviated septum    . INGUINAL HERNIA REPAIR    . ROTATOR CUFF REPAIR    . TONSILLECTOMY      Family History  Problem Relation Age of Onset  . Heart disease Mother   . GI Bleed Father   . Heart disease Brother   . Heart disease Brother     Social History   Socioeconomic History  . Marital status: Single    Spouse name: Not on file  . Number of children: Not on file  . Years of education: Not on file  . Highest education level: Not on file  Occupational History  . Not on file  Tobacco Use  . Smoking status: Former Research scientist (life sciences)  . Smokeless tobacco: Never Used  Substance and Sexual Activity  . Alcohol use: No  . Drug use: No  . Sexual activity: Not Currently  Other Topics Concern  . Not on file  Social History Narrative  . Not on file   Social Determinants of Health   Financial Resource Strain:   . Difficulty of Paying Living Expenses:     Food Insecurity:   . Worried About Charity fundraiser in the Last Year:   . Arboriculturist in the Last Year:   Transportation Needs:   . Film/video editor (Medical):   Marland Kitchen Lack of Transportation (Non-Medical):   Physical Activity:   . Days of Exercise per Week:   . Minutes of Exercise per Session:   Stress:   . Feeling of Stress :   Social Connections:   . Frequency of Communication with Friends and Family:   . Frequency of Social Gatherings with Friends and Family:   . Attends Religious Services:   . Active Member of Clubs or Organizations:   . Attends Archivist Meetings:   Marland Kitchen Marital Status:   Intimate Partner Violence:   . Fear of Current or Ex-Partner:   . Emotionally Abused:   Marland Kitchen Physically Abused:   . Sexually Abused:     Outpatient Medications Prior to Visit  Medication Sig Dispense Refill  . amLODipine-benazepril (LOTREL) 5-10 MG capsule Take 1 capsule by mouth daily. 90 capsule 3  . aspirin 81 MG tablet Take 81 mg by mouth daily.      Marland Kitchen  Cholecalciferol (VITAMIN D) 2000 units CAPS Take 2,000 Units by mouth daily.    . fish oil-omega-3 fatty acids 1000 MG capsule Take 1 g by mouth daily.      . fluticasone (FLONASE) 50 MCG/ACT nasal spray Place 2 sprays into the nose daily. 16 g 11  . gabapentin (NEURONTIN) 300 MG capsule Take 300 mg by mouth 3 (three) times daily.    Marland Kitchen glucosamine-chondroitin 500-400 MG tablet Take 1 tablet by mouth daily.    . mirabegron ER (MYRBETRIQ) 25 MG TB24 tablet TAKE 1 TABLET (25 MG TOTAL) BY MOUTH DAILY. 90 tablet 2  . Multiple Vitamins-Minerals (ICAPS) CAPS Take 1 capsule by mouth 2 (two) times daily.    Vladimir Faster Glycol-Propyl Glycol (SYSTANE) 0.4-0.3 % SOLN Apply 1 drop to eye as needed.    . rosuvastatin (CRESTOR) 10 MG tablet TAKE 1 TABLET (10 MG TOTAL) BY MOUTH DAILY. 90 tablet 3  . triamcinolone cream (KENALOG) 0.1 % Apply 1 application topically 2 (two) times daily as needed. 30 g 2   No facility-administered  medications prior to visit.    Allergies  Allergen Reactions  . Flu Virus Vaccine     rash  . Tetanus Toxoids     Fever and chills    ROS Review of Systems  Constitutional: Negative for chills and fever.  HENT: Positive for hearing loss. Negative for ear discharge and ear pain.       Objective:    Physical Exam  BP 120/66 (BP Location: Left Arm, Patient Position: Sitting, Cuff Size: Normal)   Pulse 66   Temp 97.9 F (36.6 C) (Temporal)   Wt 169 lb 9.6 oz (76.9 kg)   SpO2 96%   BMI 26.56 kg/m  Wt Readings from Last 3 Encounters:  09/22/19 169 lb 9.6 oz (76.9 kg)  03/03/19 166 lb 6.4 oz (75.5 kg)  03/11/18 175 lb 8 oz (79.6 kg)     Health Maintenance Due  Topic Date Due  . COVID-19 Vaccine (1) Never done    There are no preventive care reminders to display for this patient.  Lab Results  Component Value Date   TSH 1.95 05/10/2016   Lab Results  Component Value Date   WBC 12.0 (H) 05/10/2016   HGB 15.8 05/10/2016   HCT 45.9 05/10/2016   MCV 87.1 05/10/2016   PLT 242.0 05/10/2016   Lab Results  Component Value Date   NA 140 12/24/2017   K 4.7 12/24/2017   CO2 30 12/24/2017   GLUCOSE 107 (H) 12/24/2017   BUN 17 12/24/2017   CREATININE 1.12 12/24/2017   BILITOT 0.8 12/24/2017   ALKPHOS 72 12/24/2017   AST 17 12/24/2017   ALT 15 12/24/2017   PROT 7.2 12/24/2017   ALBUMIN 4.9 12/24/2017   CALCIUM 9.7 12/24/2017   GFR 65.25 12/24/2017   Lab Results  Component Value Date   CHOL 185 12/24/2017   Lab Results  Component Value Date   HDL 39.50 12/24/2017   Lab Results  Component Value Date   LDLCALC 115 (H) 12/24/2017   Lab Results  Component Value Date   TRIG 149.0 12/24/2017   Lab Results  Component Value Date   CHOLHDL 5 12/24/2017   Lab Results  Component Value Date   HGBA1C 5.5 06/16/2011      Assessment & Plan:   Problem List Items Addressed This Visit    None    Visit Diagnoses    Bilateral impacted cerumen    -  Primary    We discussed risk and benefits of ear irrigation including risk of pain, bleeding, low risk of eardrum perforation and patient consented.  Ears were irrigated per nurse and right canal was fully cleared.  We had use a curette to partially clear the left canal but this was not cleared entirely  -We recommend he apply some mineral oil drops to the left ear and leave for least 30 minutes followed by irrigation with rubber bulb syringe with lukewarm water until cerumen removed from left canal    Follow-up: No follow-ups on file.    Carolann Littler, MD

## 2019-10-09 ENCOUNTER — Other Ambulatory Visit: Payer: Self-pay

## 2019-10-10 ENCOUNTER — Ambulatory Visit (INDEPENDENT_AMBULATORY_CARE_PROVIDER_SITE_OTHER): Payer: PPO | Admitting: Family Medicine

## 2019-10-10 ENCOUNTER — Encounter: Payer: Self-pay | Admitting: Family Medicine

## 2019-10-10 VITALS — BP 158/72 | HR 77 | Temp 97.8°F | Ht 66.5 in | Wt 167.7 lb

## 2019-10-10 DIAGNOSIS — E785 Hyperlipidemia, unspecified: Secondary | ICD-10-CM

## 2019-10-10 DIAGNOSIS — I10 Essential (primary) hypertension: Secondary | ICD-10-CM | POA: Diagnosis not present

## 2019-10-10 DIAGNOSIS — R29898 Other symptoms and signs involving the musculoskeletal system: Secondary | ICD-10-CM

## 2019-10-10 MED ORDER — AMLODIPINE BESYLATE 5 MG PO TABS: 5.0000 mg | ORAL_TABLET | Freq: Every day | ORAL | 3 refills | Status: AC

## 2019-10-10 NOTE — Patient Instructions (Signed)
Start the Amlodipine 5 mg daily  Set up one month follow up  We will set up home physical therapy.

## 2019-10-10 NOTE — Progress Notes (Signed)
Established Patient Office Visit  Subjective:  Patient ID: Bryan Norris, male    DOB: 1925-01-26  Age: 86 y.o. MRN: [id number]  CC:  Chief Complaint  Patient presents with  . Knee Pain    pt has been having knee issues that are thowing him off balance     HPI Bryan Norris presents for medical follow-up.  He is accompanied by his son who lives in [place].  He seems somewhat scattered with regard to history.  He has history of hypertension, GERD, osteoarthritis, osteoporosis, BPH history, hyperlipidemia.  He seemed unclear regarding several medications.  Apparently not taking Crestor.  He is supposed be on Lotrel but apparently not taking that either.  He had been on Myrbetriq for urine urgency but states that that did not help.  He does take Flonase nasal for allergies and Systane eyedrops.  Initially complained of bilateral knee pain.  Apparently more than pain he is having some weakness lower extremities.  Ambulates with a walker.  Has become very inactive overall.  No recent fall but very high risk. He has known degenerative back issues but denies any classic lumbar stenosis symptoms. He has had multiple injections in his back previously. Denies lower extremity numbness   Past Medical History:  Diagnosis Date  . Arthritis   . Blood in stool   . Diverticulitis   . Dyslipidemia   . Esophageal stricture   . FH: colonic polyps   . GI bleed   . High cholesterol   . IBS (irritable bowel syndrome)   . Macular degeneration   . MGUS (monoclonal gammopathy of unknown significance) 01/2004  . Rosacea 2009  . Urinary incontinence     Past Surgical History:  Procedure Laterality Date  . diviated septum    . INGUINAL HERNIA REPAIR    . ROTATOR CUFF REPAIR    . TONSILLECTOMY      Family History  Problem Relation Age of Onset  . Heart disease Mother   . GI Bleed Father   . Heart disease Brother   . Heart disease Brother     Social History   Socioeconomic History  . Marital  status: Single    Spouse name: Not on file  . Number of children: Not on file  . Years of education: Not on file  . Highest education level: Not on file  Occupational History  . Not on file  Tobacco Use  . Smoking status: Former Research scientist (life sciences)  . Smokeless tobacco: Never Used  Vaping Use  . Vaping Use: Never used  Substance and Sexual Activity  . Alcohol use: No  . Drug use: No  . Sexual activity: Not Currently  Other Topics Concern  . Not on file  Social History Narrative  . Not on file   Social Determinants of Health   Financial Resource Strain:   . Difficulty of Paying Living Expenses:   Food Insecurity:   . Worried About Charity fundraiser in the Last Year:   . Arboriculturist in the Last Year:   Transportation Needs:   . Film/video editor (Medical):   Marland Kitchen Lack of Transportation (Non-Medical):   Physical Activity:   . Days of Exercise per Week:   . Minutes of Exercise per Session:   Stress:   . Feeling of Stress :   Social Connections:   . Frequency of Communication with Friends and Family:   . Frequency of Social Gatherings with Friends and Family:   .  Attends Religious Services:   . Active Member of Clubs or Organizations:   . Attends Archivist Meetings:   Marland Kitchen Marital Status:   Intimate Partner Violence:   . Fear of Current or Ex-Partner:   . Emotionally Abused:   Marland Kitchen Physically Abused:   . Sexually Abused:     Outpatient Medications Prior to Visit  Medication Sig Dispense Refill  . aspirin 81 MG tablet Take 81 mg by mouth daily.      . Cholecalciferol (VITAMIN D) 2000 units CAPS Take 2,000 Units by mouth daily.    . fish oil-omega-3 fatty acids 1000 MG capsule Take 1 g by mouth daily.      . fluticasone (FLONASE) 50 MCG/ACT nasal spray Place 2 sprays into the nose daily. 16 g 11  . gabapentin (NEURONTIN) 300 MG capsule Take 300 mg by mouth 3 (three) times daily.    Marland Kitchen glucosamine-chondroitin 500-400 MG tablet Take 1 tablet by mouth daily.    .  Multiple Vitamins-Minerals (ICAPS) CAPS Take 1 capsule by mouth 2 (two) times daily.    Vladimir Faster Glycol-Propyl Glycol (SYSTANE) 0.4-0.3 % SOLN Apply 1 drop to eye as needed.    . triamcinolone cream (KENALOG) 0.1 % Apply 1 application topically 2 (two) times daily as needed. 30 g 2  . amLODipine-benazepril (LOTREL) 5-10 MG capsule Take 1 capsule by mouth daily. 90 capsule 3  . mirabegron ER (MYRBETRIQ) 25 MG TB24 tablet TAKE 1 TABLET (25 MG TOTAL) BY MOUTH DAILY. 90 tablet 2  . rosuvastatin (CRESTOR) 10 MG tablet TAKE 1 TABLET (10 MG TOTAL) BY MOUTH DAILY. 90 tablet 3   No facility-administered medications prior to visit.    Allergies  Allergen Reactions  . Flu Virus Vaccine     rash  . Tetanus Toxoids     Fever and chills    ROS Review of Systems  Constitutional: Negative for chills, fatigue and fever.  Eyes: Negative for visual disturbance.  Respiratory: Negative for cough, chest tightness and shortness of breath.   Cardiovascular: Negative for chest pain, palpitations and leg swelling.  Neurological: Positive for weakness. Negative for dizziness, syncope, light-headedness and headaches.      Objective:    Physical Exam Vitals reviewed.  Constitutional:      Appearance: Normal appearance.  Cardiovascular:     Rate and Rhythm: Normal rate and regular rhythm.  Pulmonary:     Effort: Pulmonary effort is normal.     Breath sounds: Normal breath sounds.  Musculoskeletal:     Right lower leg: No edema.     Left lower leg: No edema.  Neurological:     Mental Status: He is alert.     Comments: Generally weak with knee extension, plantarflexion, dorsiflexion, hip flexion but no focal asymmetric weakness.  1-2+ reflexes knee and ankle bilaterally     BP (!) 158/72 (BP Location: Left Arm, Cuff Size: Normal)   Pulse 77   Temp 97.8 F (36.6 C) (Temporal)   Ht 5' 6.5" (1.689 m)   Wt 167 lb 11.2 oz (76.1 kg)   SpO2 98%   BMI 26.66 kg/m  Wt Readings from Last 3  Encounters:  10/10/19 167 lb 11.2 oz (76.1 kg)  09/22/19 169 lb 9.6 oz (76.9 kg)  03/03/19 166 lb 6.4 oz (75.5 kg)     Health Maintenance Due  Topic Date Due  . COVID-19 Vaccine (1) Never done    There are no preventive care reminders to display for this patient.  Lab Results  Component Value Date   TSH 1.95 05/10/2016   Lab Results  Component Value Date   WBC 12.0 (H) 05/10/2016   HGB 15.8 05/10/2016   HCT 45.9 05/10/2016   MCV 87.1 05/10/2016   PLT 242.0 05/10/2016   Lab Results  Component Value Date   NA 140 12/24/2017   K 4.7 12/24/2017   CO2 30 12/24/2017   GLUCOSE 107 (H) 12/24/2017   BUN 17 12/24/2017   CREATININE 1.12 12/24/2017   BILITOT 0.8 12/24/2017   ALKPHOS 72 12/24/2017   AST 17 12/24/2017   ALT 15 12/24/2017   PROT 7.2 12/24/2017   ALBUMIN 4.9 12/24/2017   CALCIUM 9.7 12/24/2017   GFR 65.25 12/24/2017   Lab Results  Component Value Date   CHOL 185 12/24/2017   Lab Results  Component Value Date   HDL 39.50 12/24/2017   Lab Results  Component Value Date   LDLCALC 115 (H) 12/24/2017   Lab Results  Component Value Date   TRIG 149.0 12/24/2017   Lab Results  Component Value Date   CHOLHDL 5 12/24/2017   Lab Results  Component Value Date   HGBA1C 5.5 06/16/2011      Assessment & Plan:   #1  Bilateral lower extremity weakness.  Probably related to progressive decreased activity levels. Preserved reflexes. Doubt etiology such as Guillain-Barr.  -Set up home physical therapy -Continue walker use at all times to reduce fall risk  #2 hypertension.  Currently untreated.  Repeat reading left arm which is confirmed with a couple of checks was 158/72  -Start back amlodipine 5 mg daily and reassess in office 1 month for reassessment.  We will try to leave off the benazepril at this time if we see if we can get control with one single agent  #3 poor compliance with medications -Suggested to family pillbox and setting up his medicines  for the week.    #4 history of hyperlipidemia. Given his age and lack of good compliance with medication we decided to take Crestor off his list since he was not taking this anyway  Meds ordered this encounter  Medications  . amLODipine (NORVASC) 5 MG tablet    Sig: Take 1 tablet (5 mg total) by mouth daily.    Dispense:  90 tablet    Refill:  3    Follow-up: Return in about 1 month (around 11/09/2019).    Carolann Littler, MD

## 2019-11-12 ENCOUNTER — Encounter: Payer: Self-pay | Admitting: Family Medicine

## 2019-11-12 ENCOUNTER — Ambulatory Visit (INDEPENDENT_AMBULATORY_CARE_PROVIDER_SITE_OTHER): Payer: PPO | Admitting: Family Medicine

## 2019-11-12 ENCOUNTER — Other Ambulatory Visit: Payer: Self-pay

## 2019-11-12 VITALS — BP 130/78 | HR 85 | Temp 98.1°F | Wt 164.8 lb

## 2019-11-12 DIAGNOSIS — R531 Weakness: Secondary | ICD-10-CM

## 2019-11-12 DIAGNOSIS — R3915 Urgency of urination: Secondary | ICD-10-CM | POA: Diagnosis not present

## 2019-11-12 DIAGNOSIS — I10 Essential (primary) hypertension: Secondary | ICD-10-CM | POA: Diagnosis not present

## 2019-11-12 NOTE — Progress Notes (Signed)
Established Patient Office Visit  Subjective:  Patient ID: Bryan Norris, male    DOB: Apr 03, 1925  Age: 86 y.o. MRN: [id number]  CC:  Chief Complaint  Patient presents with  . Follow-up    pt is here for 1 month follow up on hypertension     HPI Bryan Norris presents for medical follow-up accompanied by his son who lives up in [place].  When seen here a month ago his blood pressure was poorly controlled.  There was question of compliance with medications.  He had been on Lotrel.  We simplified to plain amlodipine 5 mg daily.  He is taking this and apparently they are using a pillbox.  He states he did not take his medication this morning.  Blood pressure is improved today.  No increased peripheral edema  They had concerns regarding progressive lower extremity weakness.  He ambulates with a walker.  We set up home health physical therapy but apparently he has not called them back.  They apparently left a message on his machine recently.  He has not had any falls since last visit.  He does have some generalized weakness but is very inactive.  He is not on any recent lab work.  He apparently is preparing simple breakfast such as oatmeal.  He does frozen dinners mostly for lunch and supper.  He states his appetite is fine but has lost another 3 pounds since last visit.  He has some urine urgency and has prescription of Myrbetriq but does not take this regularly.  He denies any major obstructive symptoms currently  Past Medical History:  Diagnosis Date  . Arthritis   . Blood in stool   . Diverticulitis   . Dyslipidemia   . Esophageal stricture   . FH: colonic polyps   . GI bleed   . High cholesterol   . IBS (irritable bowel syndrome)   . Macular degeneration   . MGUS (monoclonal gammopathy of unknown significance) 01/2004  . Rosacea 2009  . Urinary incontinence     Past Surgical History:  Procedure Laterality Date  . diviated septum    . INGUINAL HERNIA REPAIR    . ROTATOR CUFF  REPAIR    . TONSILLECTOMY      Family History  Problem Relation Age of Onset  . Heart disease Mother   . GI Bleed Father   . Heart disease Brother   . Heart disease Brother     Social History   Socioeconomic History  . Marital status: Single    Spouse name: Not on file  . Number of children: Not on file  . Years of education: Not on file  . Highest education level: Not on file  Occupational History  . Not on file  Tobacco Use  . Smoking status: Former Research scientist (life sciences)  . Smokeless tobacco: Never Used  Vaping Use  . Vaping Use: Never used  Substance and Sexual Activity  . Alcohol use: No  . Drug use: No  . Sexual activity: Not Currently  Other Topics Concern  . Not on file  Social History Narrative  . Not on file   Social Determinants of Health   Financial Resource Strain:   . Difficulty of Paying Living Expenses:   Food Insecurity:   . Worried About Charity fundraiser in the Last Year:   . Arboriculturist in the Last Year:   Transportation Needs:   . Film/video editor (Medical):   Marland Kitchen Lack of  Transportation (Non-Medical):   Physical Activity:   . Days of Exercise per Week:   . Minutes of Exercise per Session:   Stress:   . Feeling of Stress :   Social Connections:   . Frequency of Communication with Friends and Family:   . Frequency of Social Gatherings with Friends and Family:   . Attends Religious Services:   . Active Member of Clubs or Organizations:   . Attends Archivist Meetings:   Marland Kitchen Marital Status:   Intimate Partner Violence:   . Fear of Current or Ex-Partner:   . Emotionally Abused:   Marland Kitchen Physically Abused:   . Sexually Abused:     Outpatient Medications Prior to Visit  Medication Sig Dispense Refill  . amLODipine (NORVASC) 5 MG tablet Take 1 tablet (5 mg total) by mouth daily. 90 tablet 3  . aspirin 81 MG tablet Take 81 mg by mouth daily.      . Cholecalciferol (VITAMIN D) 2000 units CAPS Take 2,000 Units by mouth daily.    . fish  oil-omega-3 fatty acids 1000 MG capsule Take 1 g by mouth daily.      . fluticasone (FLONASE) 50 MCG/ACT nasal spray Place 2 sprays into the nose daily. 16 g 11  . glucosamine-chondroitin 500-400 MG tablet Take 1 tablet by mouth daily.    . Multiple Vitamins-Minerals (ICAPS) CAPS Take 1 capsule by mouth 2 (two) times daily.    Vladimir Faster Glycol-Propyl Glycol (SYSTANE) 0.4-0.3 % SOLN Apply 1 drop to eye as needed.    . triamcinolone cream (KENALOG) 0.1 % Apply 1 application topically 2 (two) times daily as needed. 30 g 2  . gabapentin (NEURONTIN) 300 MG capsule Take 300 mg by mouth 3 (three) times daily.     No facility-administered medications prior to visit.    Allergies  Allergen Reactions  . Flu Virus Vaccine     rash  . Tetanus Toxoids     Fever and chills    ROS Review of Systems  Constitutional: Positive for fatigue. Negative for chills and fever.  Respiratory: Negative for cough and shortness of breath.   Cardiovascular: Negative for chest pain and leg swelling.  Gastrointestinal: Negative for abdominal pain.  Neurological: Positive for weakness. Negative for dizziness and headaches.  Psychiatric/Behavioral: Negative for confusion.      Objective:    Physical Exam Vitals reviewed.  Constitutional:      Appearance: Normal appearance.  Cardiovascular:     Rate and Rhythm: Normal rate and regular rhythm.  Pulmonary:     Effort: Pulmonary effort is normal.     Breath sounds: Normal breath sounds.  Musculoskeletal:     Right lower leg: No edema.     Left lower leg: No edema.  Neurological:     General: No focal deficit present.     Mental Status: He is alert.     BP (!) 130/78 (BP Location: Left Arm, Patient Position: Sitting, Cuff Size: Normal)   Pulse 85   Temp 98.1 F (36.7 C) (Oral)   Wt 164 lb 12.8 oz (74.8 kg)   SpO2 97%   BMI 26.20 kg/m  Wt Readings from Last 3 Encounters:  11/12/19 164 lb 12.8 oz (74.8 kg)  10/10/19 167 lb 11.2 oz (76.1 kg)    09/22/19 169 lb 9.6 oz (76.9 kg)     Health Maintenance Due  Topic Date Due  . COVID-19 Vaccine (1) Never done    There are no preventive care reminders  to display for this patient.  Lab Results  Component Value Date   TSH 1.95 05/10/2016   Lab Results  Component Value Date   WBC 12.0 (H) 05/10/2016   HGB 15.8 05/10/2016   HCT 45.9 05/10/2016   MCV 87.1 05/10/2016   PLT 242.0 05/10/2016   Lab Results  Component Value Date   NA 140 12/24/2017   K 4.7 12/24/2017   CO2 30 12/24/2017   GLUCOSE 107 (H) 12/24/2017   BUN 17 12/24/2017   CREATININE 1.12 12/24/2017   BILITOT 0.8 12/24/2017   ALKPHOS 72 12/24/2017   AST 17 12/24/2017   ALT 15 12/24/2017   PROT 7.2 12/24/2017   ALBUMIN 4.9 12/24/2017   CALCIUM 9.7 12/24/2017   GFR 65.25 12/24/2017   Lab Results  Component Value Date   CHOL 185 12/24/2017   Lab Results  Component Value Date   HDL 39.50 12/24/2017   Lab Results  Component Value Date   LDLCALC 115 (H) 12/24/2017   Lab Results  Component Value Date   TRIG 149.0 12/24/2017   Lab Results  Component Value Date   CHOLHDL 5 12/24/2017   Lab Results  Component Value Date   HGBA1C 5.5 06/16/2011      Assessment & Plan:   #1 hypertension improved with recent addition of amlodipine  -Continue amlodipine 5 mg daily  #2 generalized weakness.  Suspect largely related to inactivity  -Home PT has been ordered and is pending.  Hopefully this can get started soon. -We recommend some follow-up labs including TSH, CBC, basic metabolic panel  #3 urinary urgency.  He has occasional leakage and uses a pad.  Does not describe any burning with urination or any recent fever or suggestion for UTI. -We suggest that he try to get back on the Myrbetriq 25 mg once daily  #4 social-discussed options for healthy meals such as Meals on Wheels which they will consider  No orders of the defined types were placed in this encounter.   Follow-up: Return in about 6  months (around 05/14/2020).    Carolann Littler, MD

## 2019-11-12 NOTE — Patient Instructions (Signed)

## 2019-11-13 LAB — BASIC METABOLIC PANEL
BUN/Creatinine Ratio: 15 (calc) (ref 6–22)
BUN: 18 mg/dL (ref 7–25)
CO2: 29 mmol/L (ref 20–32)
Calcium: 9.4 mg/dL (ref 8.6–10.3)
Chloride: 101 mmol/L (ref 98–110)
Creat: 1.19 mg/dL — ABNORMAL HIGH (ref 0.70–1.11)
Glucose, Bld: 97 mg/dL (ref 65–99)
Potassium: 4.7 mmol/L (ref 3.5–5.3)
Sodium: 140 mmol/L (ref 135–146)

## 2019-11-13 LAB — CBC WITH DIFFERENTIAL/PLATELET
Absolute Monocytes: 1125 cells/uL — ABNORMAL HIGH (ref 200–950)
Basophils Absolute: 151 cells/uL (ref 0–200)
Basophils Relative: 1.3 %
Eosinophils Absolute: 290 cells/uL (ref 15–500)
Eosinophils Relative: 2.5 %
HCT: 47.7 % (ref 38.5–50.0)
Hemoglobin: 15.8 g/dL (ref 13.2–17.1)
Lymphs Abs: 1311 cells/uL (ref 850–3900)
MCH: 28 pg (ref 27.0–33.0)
MCHC: 33.1 g/dL (ref 32.0–36.0)
MCV: 84.6 fL (ref 80.0–100.0)
MPV: 11.4 fL (ref 7.5–12.5)
Monocytes Relative: 9.7 %
Neutro Abs: 8723 cells/uL — ABNORMAL HIGH (ref 1500–7800)
Neutrophils Relative %: 75.2 %
Platelets: 193 10*3/uL (ref 140–400)
RBC: 5.64 10*6/uL (ref 4.20–5.80)
RDW: 13.7 % (ref 11.0–15.0)
Total Lymphocyte: 11.3 %
WBC: 11.6 10*3/uL — ABNORMAL HIGH (ref 3.8–10.8)

## 2019-11-13 LAB — HEPATIC FUNCTION PANEL
AG Ratio: 2 (calc) (ref 1.0–2.5)
ALT: 15 U/L (ref 9–46)
AST: 16 U/L (ref 10–35)
Albumin: 4.7 g/dL (ref 3.6–5.1)
Alkaline phosphatase (APISO): 87 U/L (ref 35–144)
Bilirubin, Direct: 0.1 mg/dL (ref 0.0–0.2)
Globulin: 2.4 g/dL (calc) (ref 1.9–3.7)
Indirect Bilirubin: 0.7 mg/dL (calc) (ref 0.2–1.2)
Total Bilirubin: 0.8 mg/dL (ref 0.2–1.2)
Total Protein: 7.1 g/dL (ref 6.1–8.1)

## 2019-11-13 LAB — TSH: TSH: 2.2 mIU/L (ref 0.40–4.50)

## 2020-02-17 DIAGNOSIS — H35371 Puckering of macula, right eye: Secondary | ICD-10-CM | POA: Diagnosis not present

## 2020-02-17 DIAGNOSIS — H353122 Nonexudative age-related macular degeneration, left eye, intermediate dry stage: Secondary | ICD-10-CM | POA: Diagnosis not present

## 2020-02-17 DIAGNOSIS — H15833 Staphyloma posticum, bilateral: Secondary | ICD-10-CM | POA: Diagnosis not present

## 2020-02-17 DIAGNOSIS — H353113 Nonexudative age-related macular degeneration, right eye, advanced atrophic without subfoveal involvement: Secondary | ICD-10-CM | POA: Diagnosis not present

## 2020-03-01 ENCOUNTER — Ambulatory Visit: Payer: PPO

## 2020-03-01 NOTE — Progress Notes (Signed)
Erroneous encounter

## 2020-03-31 ENCOUNTER — Ambulatory Visit (INDEPENDENT_AMBULATORY_CARE_PROVIDER_SITE_OTHER): Payer: PPO

## 2020-03-31 ENCOUNTER — Other Ambulatory Visit: Payer: Self-pay

## 2020-03-31 DIAGNOSIS — Z0000 Encounter for general adult medical examination without abnormal findings: Secondary | ICD-10-CM | POA: Diagnosis not present

## 2020-03-31 NOTE — Progress Notes (Signed)
Virtual Visit via Telephone Note  I connected with  Bryan Norris on 03/31/20 at  2:30 PM EST by telephone and verified that I am speaking with the correct person using two identifiers.  Medicare Annual Wellness visit completed telephonically due to Covid-19 pandemic.   Persons participating in this call: This Health Coach and this patient.   Location: Patient: Home Provider: [place]   I discussed the limitations, risks, security and privacy concerns of performing an evaluation and management service by telephone and the availability of in person appointments. The patient expressed understanding and agreed to proceed.  Unable to perform video visit due to video visit attempted and failed and/or patient does not have video capability.   Some vital signs may be absent or patient reported.   Willette Brace, LPN    Subjective:   Bryan Norris is a 86 y.o. male who presents for Medicare Annual/Subsequent preventive examination.  Review of Systems     Cardiac Risk Factors include: advanced age (>26men, >49 women);male gender;hypertension;dyslipidemia;sedentary lifestyle     Objective:    There were no vitals filed for this visit. There is no height or weight on file to calculate BMI.  Advanced Directives 10/04/2016  Does Patient Have a Medical Advance Directive? Yes  Type of Paramedic of [place];Living will  Copy of [place] in Chart? No - copy requested    Current Medications (verified) Outpatient Encounter Medications as of 03/31/2020  Medication Sig  . amLODipine (NORVASC) 5 MG tablet Take 1 tablet (5 mg total) by mouth daily.  Marland Kitchen aspirin 81 MG tablet Take 81 mg by mouth daily.  . Cholecalciferol (VITAMIN D) 2000 units CAPS Take 2,000 Units by mouth daily.  . fluticasone (FLONASE) 50 MCG/ACT nasal spray Place 2 sprays into the nose daily.  . Multiple Vitamins-Minerals (ICAPS) CAPS Take 1 capsule by mouth 2 (two) times daily.  Vladimir Faster Glycol-Propyl Glycol 0.4-0.3 % SOLN Apply 1 drop to eye as needed.  . triamcinolone cream (KENALOG) 0.1 % Apply 1 application topically 2 (two) times daily as needed.  . vitamin C (ASCORBIC ACID) 250 MG tablet Take 250 mg by mouth daily.  . fish oil-omega-3 fatty acids 1000 MG capsule Take 1 g by mouth daily. (Patient not taking: Reported on 03/31/2020)  . glucosamine-chondroitin 500-400 MG tablet Take 1 tablet by mouth daily. (Patient not taking: Reported on 03/31/2020)   No facility-administered encounter medications on file as of 03/31/2020.    Allergies (verified) Flu virus vaccine and Tetanus toxoids   History: Past Medical History:  Diagnosis Date  . Arthritis   . Blood in stool   . Diverticulitis   . Dyslipidemia   . Esophageal stricture   . FH: colonic polyps   . GI bleed   . High cholesterol   . IBS (irritable bowel syndrome)   . Macular degeneration   . MGUS (monoclonal gammopathy of unknown significance) 01/2004  . Rosacea 2009  . Urinary incontinence    Past Surgical History:  Procedure Laterality Date  . diviated septum    . INGUINAL HERNIA REPAIR    . ROTATOR CUFF REPAIR    . TONSILLECTOMY     Family History  Problem Relation Age of Onset  . Heart disease Mother   . GI Bleed Father   . Heart disease Brother   . Heart disease Brother    Social History   Socioeconomic History  . Marital status: Single    Spouse  name: Not on file  . Number of children: Not on file  . Years of education: Not on file  . Highest education level: Not on file  Occupational History  . Not on file  Tobacco Use  . Smoking status: Former Research scientist (life sciences)  . Smokeless tobacco: Never Used  Vaping Use  . Vaping Use: Never used  Substance and Sexual Activity  . Alcohol use: No  . Drug use: No  . Sexual activity: Not Currently  Other Topics Concern  . Not on file  Social History Narrative  . Not on file   Social Determinants of Health   Financial Resource Strain: Low  Risk   . Difficulty of Paying Living Expenses: Not hard at all  Food Insecurity: No Food Insecurity  . Worried About Charity fundraiser in the Last Year: Never true  . Ran Out of Food in the Last Year: Never true  Transportation Needs: No Transportation Needs  . Lack of Transportation (Medical): No  . Lack of Transportation (Non-Medical): No  Physical Activity: Inactive  . Days of Exercise per Week: 0 days  . Minutes of Exercise per Session: 0 min  Stress: No Stress Concern Present  . Feeling of Stress : Not at all  Social Connections: Socially Isolated  . Frequency of Communication with Friends and Family: Three times a week  . Frequency of Social Gatherings with Friends and Family: Once a week  . Attends Religious Services: Never  . Active Member of Clubs or Organizations: No  . Attends Archivist Meetings: Never  . Marital Status: Never married    Tobacco Counseling Counseling given: Not Answered   Clinical Intake:  Pre-visit preparation completed: Yes  Pain : No/denies pain     BMI - recorded: 26.2 Nutritional Status: BMI 25 -29 Overweight Nutritional Risks: None Diabetes: No  How often do you need to have someone help you when you read instructions, pamphlets, or other written materials from your doctor or pharmacy?: 1 - Never  Diabetic?No  Interpreter Needed?: No  Information entered by :: Charlott Rakes, LPN   Activities of Daily Living In your present state of health, do you have any difficulty performing the following activities: 03/31/2020  Hearing? Y  Comment hard of hearing  Vision? N  Difficulty concentrating or making decisions? N  Walking or climbing stairs? Y  Comment use a cane and takes his time  Dressing or bathing? N  Doing errands, shopping? N  Preparing Food and eating ? N  Using the Toilet? N  In the past six months, have you accidently leaked urine? Y  Comment wears adult underwear  Do you have problems with loss of  bowel control? N  Managing your Medications? N  Managing your Finances? N  Housekeeping or managing your Housekeeping? N  Some recent data might be hidden    Patient Care Team: Eulas Post, MD as PCP - General (Family Medicine) Lavonna Monarch, MD as Consulting Physician (Dermatology) Sherlynn Stalls, MD as Consulting Physician (Ophthalmology) Anna Genre, DMD as Consulting Physician (Dentistry)  Indicate any recent Medical Services you may have received from other than [facility] providers in the past year (date may be approximate).     Assessment:   This is a routine wellness examination for Bryan Norris.  Hearing/Vision screen  Hearing Screening   125Hz  250Hz  500Hz  1000Hz  2000Hz  3000Hz  4000Hz  6000Hz  8000Hz   Right ear:           Left ear:  Comments: Hard  of hearing   Vision Screening Comments: See Dr Baird Cancer for eye exams has appt in feb  Dietary issues and exercise activities discussed: Current Exercise Habits: The patient does not participate in regular exercise at present  Goals    .  Maintain current health status (pt-stated)      Travel more if possible.      Depression Screen PHQ 2/9 Scores 03/31/2020 10/10/2019 03/11/2018 10/04/2016 05/10/2016 03/31/2015 03/31/2015  PHQ - 2 Score 0 0 0 0 0 0 0    Fall Risk Fall Risk  03/31/2020 03/07/2019 03/11/2018 12/29/2016 10/04/2016  Falls in the past year? 0 0 1 Yes Yes  Comment - Emmi Telephone Survey: data to providers prior to load - - -  Number falls in past yr: 0 - 0 2 or more 1  Injury with Fall? 0 - 1 No No  Risk for fall due to : Impaired mobility;Impaired balance/gait - - - Impaired balance/gait;Impaired mobility  Follow up Falls prevention discussed - - - -    FALL RISK PREVENTION PERTAINING TO THE HOME:  Any stairs in or around the home? Yes  If so, are there any without handrails? No  Home free of loose throw rugs in walkways, pet beds, electrical cords, etc? Yes  Adequate lighting in your home to reduce  risk of falls? Yes   ASSISTIVE DEVICES UTILIZED TO PREVENT FALLS:  Life alert? No  Use of a cane, walker or w/c? Yes  Grab bars in the bathroom? No  Shower chair or bench in shower? Yes  Elevated toilet seat or a handicapped toilet? Yes   TIMED UP AND GO:  Was the test performed? No .     Cognitive Function:Unable to hear well enough to recall MMSE - Mini Mental State Exam 10/04/2016  Orientation to time 4  Orientation to Place 5  Registration 3  Attention/ Calculation 5  Recall 2  Language- name 2 objects 2  Language- repeat 1  Language- follow 3 step command 3  Language- read & follow direction 1  Write a sentence 1  Copy design 1  Total score 28        Immunizations Immunization History  Administered Date(s) Administered  . DTaP 06/16/2011  . Influenza Whole 03/07/2011  . Influenza, High Dose Seasonal PF 01/12/2017, 12/24/2017  . Pneumococcal Conjugate-13 11/06/2013  . Pneumococcal Polysaccharide-23 03/31/2015  . Zoster 05/08/2013      Flu Vaccine status: Due, Education has been provided regarding the importance of this vaccine. Advised may receive this vaccine at local pharmacy or Health Dept. Aware to provide a copy of the vaccination record if obtained from local pharmacy or Health Dept. Verbalized acceptance and understanding.  Pneumococcal vaccine status: Up to date  Covid-19 vaccine status: Completed vaccines pt stated his children has his card he recalls having 2 shot s and needs the booster   Qualifies for Shingles Vaccine? Yes   Zostavax completed Yes   Shingrix Completed?: No.    Education has been provided regarding the importance of this vaccine. Patient has been advised to call insurance company to determine out of pocket expense if they have not yet received this vaccine. Advised may also receive vaccine at local pharmacy or Health Dept. Verbalized acceptance and understanding.  Screening Tests Health Maintenance  Topic Date Due  . COVID-19  Vaccine (1) Never done  . PNA vac Low Risk Adult  Completed    Health Maintenance  Health Maintenance Due  Topic Date Due  .  COVID-19 Vaccine (1) Never done    Colorectal cancer screening: No longer required.    Additional Screening:  Vision Screening: Recommended annual ophthalmology exams for early detection of glaucoma and other disorders of the eye. Is the patient up to date with their annual eye exam?  Yes  Who is the provider or what is the name of the office in which the patient attends annual eye exams? Dr Baird Cancer has appt in Feb 2022  Dental Screening: Recommended annual dental exams for proper oral hygiene  Community Resource Referral / Chronic Care Management: CRR required this visit?  No   CCM required this visit?  No      Plan:     I have personally reviewed and noted the following in the patient's chart:   . Medical and social history . Use of alcohol, tobacco or illicit drugs  . Current medications and supplements . Functional ability and status . Nutritional status . Physical activity . Advanced directives . List of other physicians . Hospitalizations, surgeries, and ER visits in previous 12 months . Vitals . Screenings to include cognitive, depression, and falls . Referrals and appointments  In addition, I have reviewed and discussed with patient certain preventive protocols, quality metrics, and best practice recommendations. A written personalized care plan for preventive services as well as general preventive health recommendations were provided to patient.     Willette Brace, LPN   [id number]   Nurse Notes: Pt is very hard of hearing states he will come in to office to speak with Dr Elease Hashimoto for any further questions

## 2020-03-31 NOTE — Patient Instructions (Signed)
Bryan Norris , Thank you for taking time to come for your Medicare Wellness Visit. I appreciate your ongoing commitment to your health goals. Please review the following plan we discussed and let me know if I can assist you in the future.   Screening recommendations/referrals: Colonoscopy: No longer required  Recommended yearly ophthalmology/optometry visit for glaucoma screening and checkup Recommended yearly dental visit for hygiene and checkup  Vaccinations: Influenza vaccine: Due and discussed Pneumococcal vaccine: Up to date Shingles vaccine: k  Shingrix discussed. Please contact your pharmacy for coverage information.  Covid-19: Pt stated completion unsure of dates   Conditions/risks identified: None at this time  Next appointment: Follow up in one year for your annual wellness visit.   Preventive Care 84 Years and Older, Male Preventive care refers to lifestyle choices and visits with your health care provider that can promote health and wellness. What does preventive care include?  A yearly physical exam. This is also called an annual well check.  Dental exams once or twice a year.  Routine eye exams. Ask your health care provider how often you should have your eyes checked.  Personal lifestyle choices, including:  Daily care of your teeth and gums.  Regular physical activity.  Eating a healthy diet.  Avoiding tobacco and drug use.  Limiting alcohol use.  Practicing safe sex.  Taking low doses of aspirin every day.  Taking vitamin and mineral supplements as recommended by your health care provider. What happens during an annual well check? The services and screenings done by your health care provider during your annual well check will depend on your age, overall health, lifestyle risk factors, and family history of disease. Counseling  Your health care provider may ask you questions about your:  Alcohol use.  Tobacco use.  Drug use.  Emotional  well-being.  Home and relationship well-being.  Sexual activity.  Eating habits.  History of falls.  Memory and ability to understand (cognition).  Work and work Statistician. Screening  You may have the following tests or measurements:  Height, weight, and BMI.  Blood pressure.  Lipid and cholesterol levels. These may be checked every 5 years, or more frequently if you are over 62 years old.  Skin check.  Lung cancer screening. You may have this screening every year starting at age 84 if you have a 30-pack-year history of smoking and currently smoke or have quit within the past 15 years.  Fecal occult blood test (FOBT) of the stool. You may have this test every year starting at age 84.  Flexible sigmoidoscopy or colonoscopy. You may have a sigmoidoscopy every 5 years or a colonoscopy every 10 years starting at age 84.  Prostate cancer screening. Recommendations will vary depending on your family history and other risks.  Hepatitis C blood test.  Hepatitis B blood test.  Sexually transmitted disease (STD) testing.  Diabetes screening. This is done by checking your blood sugar (glucose) after you have not eaten for a while (fasting). You may have this done every 1-3 years.  Abdominal aortic aneurysm (AAA) screening. You may need this if you are a current or former smoker.  Osteoporosis. You may be screened starting at age 84 if you are at high risk. Talk with your health care provider about your test results, treatment options, and if necessary, the need for more tests. Vaccines  Your health care provider may recommend certain vaccines, such as:  Influenza vaccine. This is recommended every year.  Tetanus, diphtheria, and acellular pertussis (  Tdap, Td) vaccine. You may need a Td booster every 10 years.  Zoster vaccine. You may need this after age 84.  Pneumococcal 13-valent conjugate (PCV13) vaccine. One dose is recommended after age 84.  Pneumococcal  polysaccharide (PPSV23) vaccine. One dose is recommended after age 84. Talk to your health care provider about which screenings and vaccines you need and how often you need them. This information is not intended to replace advice given to you by your health care provider. Make sure you discuss any questions you have with your health care provider. Document Released: 04/30/2015 Document Revised: 12/22/2015 Document Reviewed: 02/02/2015 Elsevier Interactive Patient Education  2017 [employer] Prevention in the Home Falls can cause injuries. They can happen to people of all ages. There are many things you can do to make your home safe and to help prevent falls. What can I do on the outside of my home?  Regularly fix the edges of walkways and driveways and fix any cracks.  Remove anything that might make you trip as you walk through a door, such as a raised step or threshold.  Trim any bushes or trees on the path to your home.  Use bright outdoor lighting.  Clear any walking paths of anything that might make someone trip, such as rocks or tools.  Regularly check to see if handrails are loose or broken. Make sure that both sides of any steps have handrails.  Any raised decks and porches should have guardrails on the edges.  Have any leaves, snow, or ice cleared regularly.  Use sand or salt on walking paths during winter.  Clean up any spills in your garage right away. This includes oil or grease spills. What can I do in the bathroom?  Use night lights.  Install grab bars by the toilet and in the tub and shower. Do not use towel bars as grab bars.  Use non-skid mats or decals in the tub or shower.  If you need to sit down in the shower, use a plastic, non-slip stool.  Keep the floor dry. Clean up any water that spills on the floor as soon as it happens.  Remove soap buildup in the tub or shower regularly.  Attach bath mats securely with double-sided non-slip rug  tape.  Do not have throw rugs and other things on the floor that can make you trip. What can I do in the bedroom?  Use night lights.  Make sure that you have a light by your bed that is easy to reach.  Do not use any sheets or blankets that are too big for your bed. They should not hang down onto the floor.  Have a firm chair that has side arms. You can use this for support while you get dressed.  Do not have throw rugs and other things on the floor that can make you trip. What can I do in the kitchen?  Clean up any spills right away.  Avoid walking on wet floors.  Keep items that you use a lot in easy-to-reach places.  If you need to reach something above you, use a strong step stool that has a grab bar.  Keep electrical cords out of the way.  Do not use floor polish or wax that makes floors slippery. If you must use wax, use non-skid floor wax.  Do not have throw rugs and other things on the floor that can make you trip. What can I do with my stairs?  Do  not leave any items on the stairs.  Make sure that there are handrails on both sides of the stairs and use them. Fix handrails that are broken or loose. Make sure that handrails are as long as the stairways.  Check any carpeting to make sure that it is firmly attached to the stairs. Fix any carpet that is loose or worn.  Avoid having throw rugs at the top or bottom of the stairs. If you do have throw rugs, attach them to the floor with carpet tape.  Make sure that you have a light switch at the top of the stairs and the bottom of the stairs. If you do not have them, ask someone to add them for you. What else can I do to help prevent falls?  Wear shoes that:  Do not have high heels.  Have rubber bottoms.  Are comfortable and fit you well.  Are closed at the toe. Do not wear sandals.  If you use a stepladder:  Make sure that it is fully opened. Do not climb a closed stepladder.  Make sure that both sides of the  stepladder are locked into place.  Ask someone to hold it for you, if possible.  Clearly mark and make sure that you can see:  Any grab bars or handrails.  First and last steps.  Where the edge of each step is.  Use tools that help you move around (mobility aids) if they are needed. These include:  Canes.  Walkers.  Scooters.  Crutches.  Turn on the lights when you go into a dark area. Replace any light bulbs as soon as they burn out.  Set up your furniture so you have a clear path. Avoid moving your furniture around.  If any of your floors are uneven, fix them.  If there are any pets around you, be aware of where they are.  Review your medicines with your doctor. Some medicines can make you feel dizzy. This can increase your chance of falling. Ask your doctor what other things that you can do to help prevent falls. This information is not intended to replace advice given to you by your health care provider. Make sure you discuss any questions you have with your health care provider. Document Released: 01/28/2009 Document Revised: 09/09/2015 Document Reviewed: 05/08/2014 Elsevier Interactive Patient Education  2017 [employer].

## 2020-04-19 DIAGNOSIS — S51812A Laceration without foreign body of left forearm, initial encounter: Secondary | ICD-10-CM | POA: Diagnosis not present

## 2020-04-19 DIAGNOSIS — Z9181 History of falling: Secondary | ICD-10-CM | POA: Diagnosis not present

## 2020-04-19 DIAGNOSIS — Z602 Problems related to living alone: Secondary | ICD-10-CM | POA: Diagnosis not present

## 2020-04-19 DIAGNOSIS — N390 Urinary tract infection, site not specified: Secondary | ICD-10-CM | POA: Diagnosis not present

## 2020-04-19 DIAGNOSIS — G9341 Metabolic encephalopathy: Secondary | ICD-10-CM | POA: Diagnosis not present

## 2020-04-19 DIAGNOSIS — Z79899 Other long term (current) drug therapy: Secondary | ICD-10-CM | POA: Diagnosis not present

## 2020-04-19 DIAGNOSIS — E119 Type 2 diabetes mellitus without complications: Secondary | ICD-10-CM | POA: Diagnosis not present

## 2020-04-19 DIAGNOSIS — R531 Weakness: Secondary | ICD-10-CM | POA: Diagnosis not present

## 2020-04-19 DIAGNOSIS — I639 Cerebral infarction, unspecified: Secondary | ICD-10-CM | POA: Diagnosis not present

## 2020-04-19 DIAGNOSIS — D72829 Elevated white blood cell count, unspecified: Secondary | ICD-10-CM | POA: Diagnosis not present

## 2020-04-19 DIAGNOSIS — G8194 Hemiplegia, unspecified affecting left nondominant side: Secondary | ICD-10-CM | POA: Diagnosis not present

## 2020-04-19 DIAGNOSIS — I6782 Cerebral ischemia: Secondary | ICD-10-CM | POA: Diagnosis not present

## 2020-04-19 DIAGNOSIS — F10229 Alcohol dependence with intoxication, unspecified: Secondary | ICD-10-CM | POA: Diagnosis not present

## 2020-04-19 DIAGNOSIS — M25512 Pain in left shoulder: Secondary | ICD-10-CM | POA: Diagnosis not present

## 2020-04-19 DIAGNOSIS — I63421 Cerebral infarction due to embolism of right anterior cerebral artery: Secondary | ICD-10-CM | POA: Diagnosis not present

## 2020-04-19 DIAGNOSIS — A419 Sepsis, unspecified organism: Secondary | ICD-10-CM | POA: Diagnosis not present

## 2020-04-19 DIAGNOSIS — I10 Essential (primary) hypertension: Secondary | ICD-10-CM | POA: Diagnosis not present

## 2020-04-19 DIAGNOSIS — Z8673 Personal history of transient ischemic attack (TIA), and cerebral infarction without residual deficits: Secondary | ICD-10-CM | POA: Diagnosis not present

## 2020-04-19 DIAGNOSIS — E785 Hyperlipidemia, unspecified: Secondary | ICD-10-CM | POA: Diagnosis not present

## 2020-04-19 DIAGNOSIS — Z87891 Personal history of nicotine dependence: Secondary | ICD-10-CM | POA: Diagnosis not present

## 2020-04-20 ENCOUNTER — Telehealth: Payer: Self-pay | Admitting: Family Medicine

## 2020-04-20 DIAGNOSIS — R278 Other lack of coordination: Secondary | ICD-10-CM | POA: Diagnosis not present

## 2020-04-20 DIAGNOSIS — Z79899 Other long term (current) drug therapy: Secondary | ICD-10-CM | POA: Diagnosis not present

## 2020-04-20 DIAGNOSIS — D72829 Elevated white blood cell count, unspecified: Secondary | ICD-10-CM | POA: Diagnosis not present

## 2020-04-20 DIAGNOSIS — H3530 Unspecified macular degeneration: Secondary | ICD-10-CM | POA: Diagnosis not present

## 2020-04-20 DIAGNOSIS — G8194 Hemiplegia, unspecified affecting left nondominant side: Secondary | ICD-10-CM | POA: Diagnosis not present

## 2020-04-20 DIAGNOSIS — N390 Urinary tract infection, site not specified: Secondary | ICD-10-CM | POA: Diagnosis not present

## 2020-04-20 DIAGNOSIS — R4182 Altered mental status, unspecified: Secondary | ICD-10-CM | POA: Diagnosis not present

## 2020-04-20 DIAGNOSIS — R410 Disorientation, unspecified: Secondary | ICD-10-CM | POA: Diagnosis not present

## 2020-04-20 DIAGNOSIS — A419 Sepsis, unspecified organism: Secondary | ICD-10-CM | POA: Diagnosis not present

## 2020-04-20 DIAGNOSIS — M6281 Muscle weakness (generalized): Secondary | ICD-10-CM | POA: Diagnosis not present

## 2020-04-20 DIAGNOSIS — G9341 Metabolic encephalopathy: Secondary | ICD-10-CM | POA: Diagnosis not present

## 2020-04-20 DIAGNOSIS — Z9181 History of falling: Secondary | ICD-10-CM | POA: Diagnosis not present

## 2020-04-20 DIAGNOSIS — R41841 Cognitive communication deficit: Secondary | ICD-10-CM | POA: Diagnosis not present

## 2020-04-20 DIAGNOSIS — I69354 Hemiplegia and hemiparesis following cerebral infarction affecting left non-dominant side: Secondary | ICD-10-CM | POA: Diagnosis not present

## 2020-04-20 DIAGNOSIS — S51812A Laceration without foreign body of left forearm, initial encounter: Secondary | ICD-10-CM | POA: Diagnosis not present

## 2020-04-20 DIAGNOSIS — G319 Degenerative disease of nervous system, unspecified: Secondary | ICD-10-CM | POA: Diagnosis not present

## 2020-04-20 DIAGNOSIS — I739 Peripheral vascular disease, unspecified: Secondary | ICD-10-CM | POA: Diagnosis not present

## 2020-04-20 DIAGNOSIS — F10229 Alcohol dependence with intoxication, unspecified: Secondary | ICD-10-CM | POA: Diagnosis not present

## 2020-04-20 DIAGNOSIS — E119 Type 2 diabetes mellitus without complications: Secondary | ICD-10-CM | POA: Diagnosis not present

## 2020-04-20 DIAGNOSIS — R531 Weakness: Secondary | ICD-10-CM | POA: Diagnosis not present

## 2020-04-20 DIAGNOSIS — I639 Cerebral infarction, unspecified: Secondary | ICD-10-CM | POA: Diagnosis not present

## 2020-04-20 DIAGNOSIS — Z87891 Personal history of nicotine dependence: Secondary | ICD-10-CM | POA: Diagnosis not present

## 2020-04-20 DIAGNOSIS — Z602 Problems related to living alone: Secondary | ICD-10-CM | POA: Diagnosis not present

## 2020-04-20 DIAGNOSIS — Z7401 Bed confinement status: Secondary | ICD-10-CM | POA: Diagnosis not present

## 2020-04-20 DIAGNOSIS — I10 Essential (primary) hypertension: Secondary | ICD-10-CM | POA: Diagnosis not present

## 2020-04-20 DIAGNOSIS — Z66 Do not resuscitate: Secondary | ICD-10-CM | POA: Diagnosis not present

## 2020-04-20 DIAGNOSIS — I6782 Cerebral ischemia: Secondary | ICD-10-CM | POA: Diagnosis not present

## 2020-04-20 DIAGNOSIS — R1312 Dysphagia, oropharyngeal phase: Secondary | ICD-10-CM | POA: Diagnosis not present

## 2020-04-20 DIAGNOSIS — E785 Hyperlipidemia, unspecified: Secondary | ICD-10-CM | POA: Diagnosis not present

## 2020-04-20 DIAGNOSIS — I63421 Cerebral infarction due to embolism of right anterior cerebral artery: Secondary | ICD-10-CM | POA: Diagnosis not present

## 2020-04-20 DIAGNOSIS — I6389 Other cerebral infarction: Secondary | ICD-10-CM | POA: Diagnosis not present

## 2020-04-20 DIAGNOSIS — R2689 Other abnormalities of gait and mobility: Secondary | ICD-10-CM | POA: Diagnosis not present

## 2020-04-20 NOTE — Telephone Encounter (Signed)
Spoke with patient's daughter.  He apparently had stroke with some left hemiparesis here in [place].  They did not realize he had a stroke until he had been transported to stay with family down in [facility].  He had evidence for stroke on MRI.  Initial CT scan was unremarkable.  They are looking at some rehab there probably prior to him being discharged

## 2020-04-20 NOTE — Telephone Encounter (Signed)
Patient daughter called and wanted to let provider know that patient had a stroke and is in the [facility]  in [place], [place]. [place] is [phone]

## 2020-04-28 DIAGNOSIS — I10 Essential (primary) hypertension: Secondary | ICD-10-CM | POA: Diagnosis not present

## 2020-04-28 DIAGNOSIS — I639 Cerebral infarction, unspecified: Secondary | ICD-10-CM | POA: Diagnosis not present

## 2020-04-28 DIAGNOSIS — R059 Cough, unspecified: Secondary | ICD-10-CM | POA: Diagnosis not present

## 2020-04-28 DIAGNOSIS — M6281 Muscle weakness (generalized): Secondary | ICD-10-CM | POA: Diagnosis not present

## 2020-04-28 DIAGNOSIS — I69354 Hemiplegia and hemiparesis following cerebral infarction affecting left non-dominant side: Secondary | ICD-10-CM | POA: Diagnosis not present

## 2020-04-28 DIAGNOSIS — R1312 Dysphagia, oropharyngeal phase: Secondary | ICD-10-CM | POA: Diagnosis not present

## 2020-04-28 DIAGNOSIS — R4182 Altered mental status, unspecified: Secondary | ICD-10-CM | POA: Diagnosis not present

## 2020-04-28 DIAGNOSIS — N390 Urinary tract infection, site not specified: Secondary | ICD-10-CM | POA: Diagnosis not present

## 2020-04-28 DIAGNOSIS — H3530 Unspecified macular degeneration: Secondary | ICD-10-CM | POA: Diagnosis not present

## 2020-04-28 DIAGNOSIS — R1310 Dysphagia, unspecified: Secondary | ICD-10-CM | POA: Diagnosis not present

## 2020-04-28 DIAGNOSIS — R2689 Other abnormalities of gait and mobility: Secondary | ICD-10-CM | POA: Diagnosis not present

## 2020-04-28 DIAGNOSIS — Z7401 Bed confinement status: Secondary | ICD-10-CM | POA: Diagnosis not present

## 2020-04-28 DIAGNOSIS — R410 Disorientation, unspecified: Secondary | ICD-10-CM | POA: Diagnosis not present

## 2020-04-28 DIAGNOSIS — I6389 Other cerebral infarction: Secondary | ICD-10-CM | POA: Diagnosis not present

## 2020-04-28 DIAGNOSIS — R531 Weakness: Secondary | ICD-10-CM | POA: Diagnosis not present

## 2020-04-28 DIAGNOSIS — R062 Wheezing: Secondary | ICD-10-CM | POA: Diagnosis not present

## 2020-04-28 DIAGNOSIS — E119 Type 2 diabetes mellitus without complications: Secondary | ICD-10-CM | POA: Diagnosis not present

## 2020-04-28 DIAGNOSIS — R41841 Cognitive communication deficit: Secondary | ICD-10-CM | POA: Diagnosis not present

## 2020-04-28 DIAGNOSIS — R278 Other lack of coordination: Secondary | ICD-10-CM | POA: Diagnosis not present

## 2020-04-30 DIAGNOSIS — R4182 Altered mental status, unspecified: Secondary | ICD-10-CM | POA: Diagnosis not present

## 2020-04-30 DIAGNOSIS — R1310 Dysphagia, unspecified: Secondary | ICD-10-CM | POA: Diagnosis not present

## 2020-04-30 DIAGNOSIS — N390 Urinary tract infection, site not specified: Secondary | ICD-10-CM | POA: Diagnosis not present

## 2020-04-30 DIAGNOSIS — I10 Essential (primary) hypertension: Secondary | ICD-10-CM | POA: Diagnosis not present

## 2020-04-30 DIAGNOSIS — E119 Type 2 diabetes mellitus without complications: Secondary | ICD-10-CM | POA: Diagnosis not present

## 2020-04-30 DIAGNOSIS — I69354 Hemiplegia and hemiparesis following cerebral infarction affecting left non-dominant side: Secondary | ICD-10-CM | POA: Diagnosis not present

## 2020-05-18 ENCOUNTER — Ambulatory Visit: Payer: PPO | Admitting: Family Medicine

## 2020-05-18 DIAGNOSIS — R062 Wheezing: Secondary | ICD-10-CM | POA: Diagnosis not present

## 2020-05-18 DIAGNOSIS — R059 Cough, unspecified: Secondary | ICD-10-CM | POA: Diagnosis not present

## 2020-05-25 DIAGNOSIS — R4182 Altered mental status, unspecified: Secondary | ICD-10-CM | POA: Diagnosis not present

## 2020-05-25 DIAGNOSIS — E119 Type 2 diabetes mellitus without complications: Secondary | ICD-10-CM | POA: Diagnosis not present

## 2020-05-25 DIAGNOSIS — I69354 Hemiplegia and hemiparesis following cerebral infarction affecting left non-dominant side: Secondary | ICD-10-CM | POA: Diagnosis not present

## 2020-05-25 DIAGNOSIS — N390 Urinary tract infection, site not specified: Secondary | ICD-10-CM | POA: Diagnosis not present

## 2020-05-25 DIAGNOSIS — R1310 Dysphagia, unspecified: Secondary | ICD-10-CM | POA: Diagnosis not present

## 2020-05-25 DIAGNOSIS — I10 Essential (primary) hypertension: Secondary | ICD-10-CM | POA: Diagnosis not present

## 2020-07-08 DIAGNOSIS — M25662 Stiffness of left knee, not elsewhere classified: Secondary | ICD-10-CM | POA: Diagnosis not present

## 2020-07-08 DIAGNOSIS — R41841 Cognitive communication deficit: Secondary | ICD-10-CM | POA: Diagnosis not present

## 2020-07-08 DIAGNOSIS — M6281 Muscle weakness (generalized): Secondary | ICD-10-CM | POA: Diagnosis not present

## 2020-07-08 DIAGNOSIS — H3530 Unspecified macular degeneration: Secondary | ICD-10-CM | POA: Diagnosis not present

## 2020-07-08 DIAGNOSIS — R278 Other lack of coordination: Secondary | ICD-10-CM | POA: Diagnosis not present

## 2020-07-08 DIAGNOSIS — I69354 Hemiplegia and hemiparesis following cerebral infarction affecting left non-dominant side: Secondary | ICD-10-CM | POA: Diagnosis not present

## 2020-07-08 DIAGNOSIS — R1312 Dysphagia, oropharyngeal phase: Secondary | ICD-10-CM | POA: Diagnosis not present

## 2020-07-08 DIAGNOSIS — M25661 Stiffness of right knee, not elsewhere classified: Secondary | ICD-10-CM | POA: Diagnosis not present

## 2020-07-08 DIAGNOSIS — R293 Abnormal posture: Secondary | ICD-10-CM | POA: Diagnosis not present

## 2020-07-08 DIAGNOSIS — M25652 Stiffness of left hip, not elsewhere classified: Secondary | ICD-10-CM | POA: Diagnosis not present
